# Patient Record
Sex: Female | Born: 1985 | Race: White | Hispanic: No | Marital: Single | Smoking: Current every day smoker
Health system: Southern US, Community
[De-identification: ages and names within clinical notes are randomized; demographics above are authoritative.]

## PROBLEM LIST (undated history)

## (undated) ENCOUNTER — Inpatient Hospital Stay (HOSPITAL_COMMUNITY): Payer: Self-pay

## (undated) DIAGNOSIS — D649 Anemia, unspecified: Secondary | ICD-10-CM

## (undated) DIAGNOSIS — Q210 Ventricular septal defect: Secondary | ICD-10-CM

## (undated) DIAGNOSIS — F32A Depression, unspecified: Secondary | ICD-10-CM

## (undated) DIAGNOSIS — F329 Major depressive disorder, single episode, unspecified: Secondary | ICD-10-CM

## (undated) DIAGNOSIS — R51 Headache: Secondary | ICD-10-CM

## (undated) DIAGNOSIS — Z8709 Personal history of other diseases of the respiratory system: Secondary | ICD-10-CM

## (undated) DIAGNOSIS — F419 Anxiety disorder, unspecified: Secondary | ICD-10-CM

## (undated) DIAGNOSIS — R011 Cardiac murmur, unspecified: Secondary | ICD-10-CM

## (undated) DIAGNOSIS — K649 Unspecified hemorrhoids: Secondary | ICD-10-CM

## (undated) HISTORY — DX: Cardiac murmur, unspecified: R01.1

## (undated) HISTORY — PX: TONSILLECTOMY AND ADENOIDECTOMY: SHX28

## (undated) HISTORY — PX: WISDOM TOOTH EXTRACTION: SHX21

## (undated) HISTORY — DX: Ventricular septal defect: Q21.0

---
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
## 2004-01-12 ENCOUNTER — Ambulatory Visit: Payer: Self-pay | Admitting: Family Medicine

## 2004-04-03 ENCOUNTER — Emergency Department (HOSPITAL_COMMUNITY): Admission: EM | Admit: 2004-04-03 | Discharge: 2004-04-03 | Payer: Self-pay | Admitting: Family Medicine

## 2004-04-11 ENCOUNTER — Ambulatory Visit (HOSPITAL_COMMUNITY): Admission: RE | Admit: 2004-04-11 | Discharge: 2004-04-11 | Payer: Self-pay | Admitting: Family Medicine

## 2004-04-11 ENCOUNTER — Ambulatory Visit: Payer: Self-pay | Admitting: Sports Medicine

## 2004-04-28 ENCOUNTER — Ambulatory Visit: Payer: Self-pay | Admitting: Family Medicine

## 2004-05-29 ENCOUNTER — Ambulatory Visit: Payer: Self-pay | Admitting: Sports Medicine

## 2004-06-11 ENCOUNTER — Emergency Department (HOSPITAL_COMMUNITY): Admission: EM | Admit: 2004-06-11 | Discharge: 2004-06-11 | Payer: Self-pay | Admitting: Family Medicine

## 2004-06-15 ENCOUNTER — Emergency Department (HOSPITAL_COMMUNITY): Admission: EM | Admit: 2004-06-15 | Discharge: 2004-06-15 | Payer: Self-pay | Admitting: Family Medicine

## 2004-06-16 ENCOUNTER — Emergency Department (HOSPITAL_COMMUNITY): Admission: EM | Admit: 2004-06-16 | Discharge: 2004-06-16 | Payer: Self-pay | Admitting: Family Medicine

## 2004-08-16 ENCOUNTER — Ambulatory Visit: Payer: Self-pay | Admitting: Family Medicine

## 2004-10-30 ENCOUNTER — Ambulatory Visit: Payer: Self-pay | Admitting: Family Medicine

## 2004-11-15 ENCOUNTER — Ambulatory Visit: Payer: Self-pay | Admitting: Family Medicine

## 2004-11-27 ENCOUNTER — Ambulatory Visit: Payer: Self-pay | Admitting: Family Medicine

## 2005-05-15 ENCOUNTER — Ambulatory Visit: Payer: Self-pay | Admitting: Family Medicine

## 2005-08-08 ENCOUNTER — Ambulatory Visit: Payer: Self-pay | Admitting: Family Medicine

## 2005-08-10 ENCOUNTER — Encounter: Admission: RE | Admit: 2005-08-10 | Discharge: 2005-08-10 | Payer: Self-pay | Admitting: Sports Medicine

## 2005-08-20 ENCOUNTER — Ambulatory Visit: Payer: Self-pay | Admitting: Family Medicine

## 2005-09-06 ENCOUNTER — Emergency Department (HOSPITAL_COMMUNITY): Admission: EM | Admit: 2005-09-06 | Discharge: 2005-09-06 | Payer: Self-pay | Admitting: Family Medicine

## 2006-04-11 DIAGNOSIS — F32A Depression, unspecified: Secondary | ICD-10-CM | POA: Insufficient documentation

## 2006-04-11 DIAGNOSIS — F329 Major depressive disorder, single episode, unspecified: Secondary | ICD-10-CM | POA: Insufficient documentation

## 2006-04-11 DIAGNOSIS — F909 Attention-deficit hyperactivity disorder, unspecified type: Secondary | ICD-10-CM

## 2006-04-11 DIAGNOSIS — Q210 Ventricular septal defect: Secondary | ICD-10-CM | POA: Insufficient documentation

## 2006-04-12 ENCOUNTER — Encounter (INDEPENDENT_AMBULATORY_CARE_PROVIDER_SITE_OTHER): Payer: Self-pay | Admitting: *Deleted

## 2007-01-31 ENCOUNTER — Ambulatory Visit: Payer: Self-pay | Admitting: Family Medicine

## 2007-01-31 DIAGNOSIS — F17200 Nicotine dependence, unspecified, uncomplicated: Secondary | ICD-10-CM

## 2007-02-03 LAB — CONVERTED CEMR LAB
Cholesterol: 151 mg/dL (ref 0–200)
HDL: 53 mg/dL (ref >39)
LDL Cholesterol: 87 mg/dL (ref 0–99)
TSH: 1.331 microintl units/mL (ref 0.350–5.50)
Total CHOL/HDL Ratio: 2.8

## 2007-03-25 ENCOUNTER — Ambulatory Visit: Payer: Self-pay | Admitting: Family Medicine

## 2007-03-25 DIAGNOSIS — L501 Idiopathic urticaria: Secondary | ICD-10-CM

## 2007-03-28 ENCOUNTER — Ambulatory Visit: Payer: Self-pay | Admitting: Internal Medicine

## 2007-04-05 ENCOUNTER — Emergency Department (HOSPITAL_COMMUNITY): Admission: EM | Admit: 2007-04-05 | Discharge: 2007-04-05 | Payer: Self-pay | Admitting: Family Medicine

## 2007-05-27 ENCOUNTER — Ambulatory Visit: Payer: Self-pay | Admitting: Family Medicine

## 2007-05-27 ENCOUNTER — Other Ambulatory Visit: Admission: RE | Admit: 2007-05-27 | Discharge: 2007-05-27 | Payer: Self-pay | Admitting: Emergency Medicine

## 2007-05-27 ENCOUNTER — Encounter: Payer: Self-pay | Admitting: Family Medicine

## 2007-05-27 LAB — CONVERTED CEMR LAB: Chlamydia, DNA Probe: NEGATIVE

## 2007-05-30 ENCOUNTER — Encounter: Payer: Self-pay | Admitting: Family Medicine

## 2007-08-14 ENCOUNTER — Ambulatory Visit: Payer: Self-pay | Admitting: Family Medicine

## 2007-08-14 ENCOUNTER — Telehealth: Payer: Self-pay | Admitting: *Deleted

## 2007-08-20 ENCOUNTER — Ambulatory Visit: Payer: Self-pay | Admitting: Family Medicine

## 2008-05-12 ENCOUNTER — Telehealth: Payer: Self-pay | Admitting: *Deleted

## 2008-05-27 ENCOUNTER — Telehealth: Payer: Self-pay | Admitting: *Deleted

## 2008-06-01 ENCOUNTER — Ambulatory Visit: Payer: Self-pay | Admitting: Family Medicine

## 2008-06-01 ENCOUNTER — Encounter: Payer: Self-pay | Admitting: Family Medicine

## 2008-06-01 ENCOUNTER — Other Ambulatory Visit: Admission: RE | Admit: 2008-06-01 | Discharge: 2008-06-01 | Payer: Self-pay | Admitting: Family Medicine

## 2008-06-01 LAB — CONVERTED CEMR LAB: Pap Smear: NORMAL

## 2008-06-04 ENCOUNTER — Encounter: Payer: Self-pay | Admitting: Family Medicine

## 2008-06-25 ENCOUNTER — Telehealth: Payer: Self-pay | Admitting: Family Medicine

## 2008-07-01 ENCOUNTER — Emergency Department (HOSPITAL_COMMUNITY): Admission: EM | Admit: 2008-07-01 | Discharge: 2008-07-01 | Payer: Self-pay | Admitting: Family Medicine

## 2008-07-09 ENCOUNTER — Ambulatory Visit: Payer: Self-pay | Admitting: Family Medicine

## 2008-07-09 DIAGNOSIS — J329 Chronic sinusitis, unspecified: Secondary | ICD-10-CM | POA: Insufficient documentation

## 2008-09-07 ENCOUNTER — Encounter: Payer: Self-pay | Admitting: Family Medicine

## 2008-10-15 ENCOUNTER — Ambulatory Visit: Payer: Self-pay | Admitting: Family Medicine

## 2008-10-20 ENCOUNTER — Ambulatory Visit: Payer: Self-pay | Admitting: Family Medicine

## 2008-10-22 ENCOUNTER — Ambulatory Visit: Payer: Self-pay | Admitting: Family Medicine

## 2008-10-27 ENCOUNTER — Telehealth: Payer: Self-pay | Admitting: Family Medicine

## 2008-10-28 ENCOUNTER — Encounter: Payer: Self-pay | Admitting: Family Medicine

## 2008-10-28 ENCOUNTER — Ambulatory Visit: Payer: Self-pay | Admitting: Family Medicine

## 2008-10-28 DIAGNOSIS — J069 Acute upper respiratory infection, unspecified: Secondary | ICD-10-CM | POA: Insufficient documentation

## 2008-10-28 DIAGNOSIS — R1011 Right upper quadrant pain: Secondary | ICD-10-CM

## 2008-11-01 LAB — CONVERTED CEMR LAB
AST: 13 units/L (ref 0–37)
Alkaline Phosphatase: 60 units/L (ref 39–117)
CO2: 23 meq/L (ref 19–32)
GGT: 13 units/L (ref 7–51)
Glucose, Bld: 82 mg/dL (ref 70–99)
Hemoglobin: 14 g/dL (ref 12.0–15.0)
Lymphs Abs: 2.4 10*3/uL (ref 0.7–4.0)
MCHC: 33.7 g/dL (ref 30.0–36.0)
MCV: 96.3 fL (ref 78.0–100.0)
Monocytes Relative: 11 % (ref 3–12)
Potassium: 3.8 meq/L (ref 3.5–5.3)
Total Protein: 7.2 g/dL (ref 6.0–8.3)
WBC: 6 10*3/uL (ref 4.0–10.5)

## 2008-11-24 ENCOUNTER — Telehealth: Payer: Self-pay | Admitting: Family Medicine

## 2008-12-20 ENCOUNTER — Telehealth: Payer: Self-pay | Admitting: *Deleted

## 2008-12-28 ENCOUNTER — Ambulatory Visit: Payer: Self-pay | Admitting: Family Medicine

## 2008-12-28 ENCOUNTER — Encounter: Payer: Self-pay | Admitting: Family Medicine

## 2008-12-30 ENCOUNTER — Ambulatory Visit: Payer: Self-pay | Admitting: Family Medicine

## 2009-02-14 ENCOUNTER — Telehealth: Payer: Self-pay | Admitting: Family Medicine

## 2009-02-18 ENCOUNTER — Encounter (INDEPENDENT_AMBULATORY_CARE_PROVIDER_SITE_OTHER): Payer: Self-pay | Admitting: *Deleted

## 2009-05-03 ENCOUNTER — Encounter: Payer: Self-pay | Admitting: Family Medicine

## 2009-05-03 ENCOUNTER — Other Ambulatory Visit: Admission: RE | Admit: 2009-05-03 | Discharge: 2009-05-03 | Payer: Self-pay | Admitting: Family Medicine

## 2009-05-03 ENCOUNTER — Ambulatory Visit: Payer: Self-pay | Admitting: Family Medicine

## 2009-05-03 LAB — CONVERTED CEMR LAB
Chlamydia, DNA Probe: NEGATIVE
Pap Smear: NEGATIVE

## 2009-05-10 ENCOUNTER — Encounter: Payer: Self-pay | Admitting: Family Medicine

## 2009-09-17 ENCOUNTER — Telehealth: Payer: Self-pay | Admitting: Family Medicine

## 2009-11-18 ENCOUNTER — Encounter: Payer: Self-pay | Admitting: Family Medicine

## 2009-11-18 ENCOUNTER — Ambulatory Visit: Payer: Self-pay | Admitting: Family Medicine

## 2009-11-18 DIAGNOSIS — A630 Anogenital (venereal) warts: Secondary | ICD-10-CM | POA: Insufficient documentation

## 2009-11-18 LAB — CONVERTED CEMR LAB: Whiff Test: NEGATIVE

## 2009-11-21 ENCOUNTER — Ambulatory Visit: Payer: Self-pay | Admitting: Family Medicine

## 2010-01-12 ENCOUNTER — Encounter: Payer: Self-pay | Admitting: Sports Medicine

## 2010-03-14 NOTE — Progress Notes (Signed)
Summary: Outside call  Phone Note Call from Patient   Summary of Call: 1 week of sore thoat with exudates, associated with right ear and jaw pain.  Subjective fevers. Bilateral eye drainage especially in mornings.  No dyspnea, chest pain or palpitation. Can eat and drink.  Agreeable to calling Monday for appt. Red flags given  Initial call taken by: Clementeen Graham MD,  September 17, 2009 12:24 PM

## 2010-03-14 NOTE — Letter (Signed)
Summary: Generic Letter  [facility]  [address]   [place], [place] [address]   Phone: [phone]  Fax: [phone]    05/10/2009  WYNNE ROZAK [address] [place], [place]  [address]  Dear Ms. ROWEN,   Your PAP and all testing were negative and normal.  Good luck with school, keep it up, you will be happy you have it.        Sincerely,   Luretha Murphy NP  Appended Document: Generic Letter mailed.

## 2010-03-14 NOTE — Assessment & Plan Note (Signed)
Summary: read tb test,tcb  Nurse Visit   Allergies: 1)  ! Asa  PPD Results    Date of reading: 11/21/2009    Results: 0 mm    Interpretation: negative  Orders Added: 1)  No Charge Patient Arrived (NCPA0) [NCPA0]

## 2010-03-14 NOTE — Letter (Signed)
Summary: Appointment - Reminder 2  [facility], Main Office  [address]   [place], [place] [address]   Phone: [phone]  Fax: [phone]     February 18, 2009 MRN: [id number]   [facility] [address] [place], [place]  [address]   Dear Ms. FILLINGIM,  Our records indicate that it is time to schedule a follow-up appointment with Dr. Tenny Craw. It is very important that we reach you to schedule this appointment. We look forward to participating in your health care needs. Please contact us at the number listed above at your earliest convenience to schedule your appointment.  If you are unable to make an appointment at this time, give [place] a call so we can update our records.  Sincerely,   Migdalia Dk [facility] Scheduling Team

## 2010-03-14 NOTE — Assessment & Plan Note (Signed)
Summary: female prob,df   Vital Signs:  Patient profile:   25 year old female Height:      63.25 inches Weight:      172.5 pounds Pulse rate:   72 / minute BP sitting:   132 / 78  (right arm) Cuff size:   regular  Vitals Entered By: Arlyss Repress CMA, (November 18, 2009 1:36 PM) CC: std check. flu shot. Is Patient Diabetic? No Pain Assessment Patient in pain? no        Primary Care Provider:  Doralee Albino MD  CC:  std check. flu shot..  History of Present Illness: Here for STD testing, boyfriend has been with another woman.  She also has a wart-like lesions on her internal labia, she had been treated in the past for such with topical Aldara.  Habits & Providers  Alcohol-Tobacco-Diet     Tobacco Status: current     Tobacco Counseling: to quit use of tobacco products     Cigarette Packs/Day: 0.5  Current Medications (verified): 1)  Ortho Tri-Cyclen Lo 0.025 Mg  Tabs (Norgestimate-Ethinyl Estradiol) .... As Directed.  Disp One Packet 2)  Lexapro 10 Mg Tabs (Escitalopram Oxalate) .... One Daily 3)  Fluticasone Propionate 50 Mcg/act Susp (Fluticasone Propionate) .... One Puff Each Nostril Daily 4)  Concerta 18 Mg Cr-Tabs (Methylphenidate Hcl) .... One By Mouth Daily 5)  Aldara 5 % Crea (Imiquimod) .... Apply To Area As Directed  Allergies (verified): 1)  ! Asa  Review of Systems GU:  Denies abnormal vaginal bleeding, discharge, and genital sores.  Physical Exam  General:  Well-developed,well-nourished,in no acute distress; alert,appropriate and cooperative throughout examination Genitalia:  Normal introitus for age, no external lesions, no vaginal discharge, mucosa pink and moist, no vaginal or cervical lesions, no vaginal atrophy, no friaility or hemorrhage, normal uterus size and position, no adnexal masses or tenderness; small wart like lesion on internal labia.   Complete Medication List: 1)  Ortho Tri-cyclen Lo 0.025 Mg Tabs (Norgestimate-ethinyl estradiol)  .... As directed.  disp one packet 2)  Lexapro 10 Mg Tabs (Escitalopram oxalate) .... One daily 3)  Fluticasone Propionate 50 Mcg/act Susp (Fluticasone propionate) .... One puff each nostril daily 4)  Concerta 18 Mg Cr-tabs (Methylphenidate hcl) .... One by mouth daily 5)  Aldara 5 % Crea (Imiquimod) .... Apply to area as directed  Other Orders: GC/Chlamydia-[facility] (87591/87491) Wet Prep- [facility] [phone]) Influenza Vaccine NON MCR (51884) TB Skin Test ([address]) Admin 1st Vaccine (30160) [facility]- Est Level  3 ([address])  Patient Instructions: 1)  Please schedule a follow-up appointment as needed .  Prescriptions: ORTHO TRI-CYCLEN LO 0.025 MG  TABS (NORGESTIMATE-ETHINYL ESTRADIOL) as directed.  Disp one packet  #1 x 11   Entered and Authorized by:   Luretha Murphy NP   Signed by:   Luretha Murphy NP on 11/18/2009   Method used:   Electronically to        [id number]* (retail)       [address]       [place], [place]  [address]       Ph: [phone]       Fax: [phone]   RxID:   [id number] LEXAPRO 10 MG TABS (ESCITALOPRAM OXALATE) one daily  #30 x 11   Entered and Authorized by:   Luretha Murphy NP   Signed by:   Luretha Murphy NP on 11/18/2009   Method used:   Electronically  to        [id number]* (retail)       [address]       [place], [place]  [address]       Ph: [phone]       Fax: [phone]   RxID:   [id number] ALDARA 5 % CREA (IMIQUIMOD) apply to area as directed  #1 x 0   Entered and Authorized by:   Luretha Murphy NP   Signed by:   Luretha Murphy NP on 11/18/2009   Method used:   Electronically to        [id number]* (retail)       [address]       [place], [place]  [address]       Ph: [phone]       Fax: [phone]   RxID:   [id number]    Immunizations Administered:  Influenza Vaccine # 1:    Vaccine  Type: Fluvax Non-MCR    Site: right deltoid    Mfr: GlaxoSmithKline    Dose: 0.5 ml    Route: IM    Given by: Loralee Pacas CMA    Exp. Date: 08/09/2010    Lot #: [id number]    VIS given: 09/06/09 version given November 18, 2009.  PPD Skin Test:    Vaccine Type: PPD    Site: right forearm    Mfr: Sanofi Pasteur    Dose: 0.1 ml    Route: ID    Given by: Loralee Pacas CMA    Exp. Date: 08/19/2011    Lot #: [id number]  Flu Vaccine Consent Questions:    Do you have a history of severe allergic reactions to this vaccine? no    Any prior history of allergic reactions to egg and/or gelatin? no    Do you have a sensitivity to the preservative Thimersol? no    Do you have a past history of Guillan-Barre Syndrome? no    Do you currently have an acute febrile illness? no    Have you ever had a severe reaction to latex? no    Vaccine information given and explained to patient? yes    Are you currently pregnant? no   Laboratory Results  Date/Time Received: November 18, 2009 1:57 PM  Date/Time Reported: November 18, 2009 2:04 PM   [employer] Source: vaginal WBC/hpf: 1-5 Bacteria/hpf: 3+  Rods Clue cells/hpf: none  Negative whiff Yeast/hpf: none Trichomonas/hpf: none Comments: ...........test performed by...........Marland KitchenTerese Door, CMA

## 2010-03-14 NOTE — Miscellaneous (Signed)
  Clinical Lists Changes  Medications: Removed medication of ALDARA 5 % CREA (IMIQUIMOD) apply to area as directed

## 2010-03-14 NOTE — Miscellaneous (Signed)
Summary: Treating family for strep throat  Daughter with positive strep and sore throat. Mother and Father also with symptoms of malaise and sore throat. None of them are allergic to PCN or amoxicillin. Treating with Amox 875mg  by mouth two times a day x 10d for mother and father. #40 for both parents. Rodney Langton MD  January 12, 2010 9:53 AM  Medications: Added new medication of AMOXICILLIN 875 MG TABS (AMOXICILLIN) One tab by mouth two times a day - Signed Rx of AMOXICILLIN 875 MG TABS (AMOXICILLIN) One tab by mouth two times a day;  #40 x 0;  Signed;  Entered by: Rodney Langton MD;  Authorized by: Rodney Langton MD;  Method used: Print then Give to Patient    Prescriptions: AMOXICILLIN 875 MG TABS (AMOXICILLIN) One tab by mouth two times a day  #40 x 0   Entered and Authorized by:   Rodney Langton MD   Signed by:   Rodney Langton MD on 01/12/2010   Method used:   Print then Give to Patient   RxID:   [id number]

## 2010-03-14 NOTE — Assessment & Plan Note (Signed)
Summary: cpe with pap/kh   Vital Signs:  Patient profile:   25 year old female Weight:      178 pounds Pulse rate:   80 / minute BP sitting:   130 / 76  (right arm)  Vitals Entered By: Arlyss Repress CMA, (May 03, 2009 3:04 PM) CC: pe with pap   Primary Care Provider:  Doralee Albino MD  CC:  pe with pap.  History of Present Illness: Here for CPE and PAP.  Denies any problems.  History of genital warts in past, no noticable lesions.  Noraml PAP history.  Remains on OCPs.  Enganged to father of her 47 year old child.  In school to become an ENT.  Current Medications (verified): 1)  Ortho Tri-Cyclen Lo 0.025 Mg  Tabs (Norgestimate-Ethinyl Estradiol) .... As Directed.  Disp One Packet 2)  Lexapro 10 Mg Tabs (Escitalopram Oxalate) .... One Daily 3)  Fluticasone Propionate 50 Mcg/act Susp (Fluticasone Propionate) .... One Puff Each Nostril Daily 4)  Concerta 18 Mg Cr-Tabs (Methylphenidate Hcl) .... One By Mouth Daily 5)  Zofran 4 Mg Tabs (Ondansetron Hcl) .... One Tablet By Mouth Every 6 Horus As Needed Nausea and Vomiting  Allergies (verified): 1)  ! Asa  Review of Systems GU:  Denies abnormal vaginal bleeding, discharge, dysuria, genital sores, and urinary frequency.  Physical Exam  General:  Well-developed,well-nourished,in no acute distress; alert,appropriate and cooperative throughout examination Genitalia:  Normal introitus for age, no external lesions, no vaginal discharge, mucosa pink and moist, no vaginal or cervical lesions, no vaginal atrophy, no friaility or hemorrhage, normal uterus size and position, no adnexal masses or tenderness   Impression & Recommendations:  Problem # 1:  SCREENING FOR MALIGNANT NEOPLASM OF THE CERVIX (ICD-V76.2)  Orders: Pap Smear-[facility] (42706-23762) [facility] - Est  18-39 yrs (83151)  Problem # 2:  CONTACT OR EXPOSURE TO OTHER VIRAL DISEASES (ICD-V01.79)  Orders: GC/Chlamydia-[facility] (87591/87491) HIV-[facility] (76160-73710) Wet Prep- [facility]  (62694) [facility] - Est  18-39 yrs (85462)  Complete Medication List: 1)  Ortho Tri-cyclen Lo 0.025 Mg Tabs (Norgestimate-ethinyl estradiol) .... As directed.  disp one packet 2)  Lexapro 10 Mg Tabs (Escitalopram oxalate) .... One daily 3)  Fluticasone Propionate 50 Mcg/act Susp (Fluticasone propionate) .... One puff each nostril daily 4)  Concerta 18 Mg Cr-tabs (Methylphenidate hcl) .... One by mouth daily 5)  Zofran 4 Mg Tabs (Ondansetron hcl) .... One tablet by mouth every 6 horus as needed nausea and vomiting  Patient Instructions: 1)  I will contact you regarding your tests today 2)  all meds refilled for 3 months supply Prescriptions: FLUTICASONE PROPIONATE 50 MCG/ACT SUSP (FLUTICASONE PROPIONATE) one puff each nostril daily  #3 x 3   Entered and Authorized by:   Luretha Murphy NP   Signed by:   Luretha Murphy NP on 05/03/2009   Method used:   Electronically to        [id number]* (retail)       [address]       [place], [place]  [address]       Ph: [phone]       Fax: [phone]   RxID:   [id number] LEXAPRO 10 MG TABS (ESCITALOPRAM OXALATE) one daily  #90 x 3   Entered and Authorized by:   Luretha Murphy NP   Signed by:   Luretha Murphy NP on 05/03/2009   Method used:   Electronically to  [id number]* (retail)       [address]       [place], [place]  [address]       Ph: [phone]       Fax: [phone]   RxID:   [id number] ORTHO TRI-CYCLEN LO 0.025 MG  TABS (NORGESTIMATE-ETHINYL ESTRADIOL) as directed.  Disp one packet  #3 x 3   Entered and Authorized by:   Luretha Murphy NP   Signed by:   Luretha Murphy NP on 05/03/2009   Method used:   Electronically to        [id number]* (retail)       [address]       [place], [place]  [address]       Ph: [phone]       Fax: [phone]   RxID:    [id number]   Laboratory Results  Date/Time Received: May 03, 2009 3:15 PM  Date/Time Reported: May 03, 2009 3:31 PM   [employer] Source: vag WBC/hpf: 0-2 Bacteria/hpf: 2+  Rods Clue cells/hpf: none  Negative whiff Yeast/hpf: none Trichomonas/hpf: none Comments: ...............test performed by......Marland KitchenBonnie A. [place], MLS (ASCP)cm

## 2010-03-14 NOTE — Progress Notes (Signed)
Summary: refill  Phone Note Refill Request Call back at Home Phone [phone] Message from:  Patient  Refills Requested: Medication #1:  CONCERTA 18 MG CR-TABS one by mouth daily Please call when ready  Initial call taken by: De Nurse,  February 14, 2009 3:47 PM  Follow-up for Phone Call        to pcp Follow-up by: Golden Circle RN,  February 14, 2009 3:55 PM  Additional Follow-up for Phone Call Additional follow up Details #1::        Handwritten Rx placed up front and patient notified. Additional Follow-up by: Doralee Albino MD,  February 14, 2009 4:04 PM

## 2010-03-14 NOTE — Letter (Signed)
Summary: Pediatric Teaching Program  Pediatric Teaching Program   Imported By: Marylou Mccoy 12/10/2008 13:35:03  _____________________________________________________________________  External Attachment:    Type:   Image     Comment:   External Document

## 2010-05-23 LAB — POCT URINALYSIS DIP (DEVICE)
Bilirubin Urine: NEGATIVE
Glucose, UA: NEGATIVE mg/dL
Ketones, ur: NEGATIVE mg/dL
Nitrite: NEGATIVE
Protein, ur: NEGATIVE mg/dL
Specific Gravity, Urine: 1.025 (ref 1.005–1.030)
Urobilinogen, UA: 0.2 mg/dL (ref 0.0–1.0)
pH: 5.5 (ref 5.0–8.0)

## 2010-05-23 LAB — DIFFERENTIAL
Basophils Absolute: 0.1 K/uL (ref 0.0–0.1)
Basophils Relative: 1 % (ref 0–1)
Eosinophils Absolute: 0 K/uL (ref 0.0–0.7)
Eosinophils Relative: 0 % (ref 0–5)
Lymphocytes Relative: 15 % (ref 12–46)
Lymphs Abs: 1.7 K/uL (ref 0.7–4.0)
Monocytes Absolute: 1.2 K/uL — ABNORMAL HIGH (ref 0.1–1.0)
Monocytes Relative: 11 % (ref 3–12)
Neutro Abs: 8.5 K/uL — ABNORMAL HIGH (ref 1.7–7.7)
Neutrophils Relative %: 74 % (ref 43–77)

## 2010-05-23 LAB — CBC
HCT: 42.1 % (ref 36.0–46.0)
Hemoglobin: 14.7 g/dL (ref 12.0–15.0)
MCHC: 34.9 g/dL (ref 30.0–36.0)
MCV: 97.3 fL (ref 78.0–100.0)
Platelets: 237 K/uL (ref 150–400)
RBC: 4.32 MIL/uL (ref 3.87–5.11)
RDW: 12.8 % (ref 11.5–15.5)
WBC: 11.5 K/uL — ABNORMAL HIGH (ref 4.0–10.5)

## 2010-05-23 LAB — POCT PREGNANCY, URINE: Preg Test, Ur: NEGATIVE

## 2010-06-27 NOTE — Assessment & Plan Note (Signed)
Kelsey [name]                            Kelsey [name]   Kelsey [name], Kelsey [name]                     MRN:          [id number]  DATE:03/28/2007                            DOB:          1985-08-08    IDENTIFICATION:  Kelsey [name] is a 25 year old with a history of a VSD  previously followed by Lorna Few who presents for continued care.   The patient was last seen in 2004.  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  Her last  echocardiogram was done in 2003 that showed normal LV function.  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  The velocity  through the defect was 4.5 meters per second.  [name], there was very mild  eccentric aortic insufficiency.  Plan was for continued followup.   The patient is followed by Doralee Albino in the [facility],  and she presents for continued care.  She was last seen there about a  month ago.   On talking to the patient, she is active.  She is a mother of 1 child  age 58 (she was pregnant when she was last seen).  She notes no  significant shortness of breath, no significant chest pains, no  palpitations, no dizziness.   ALLERGIES:  ASPIRIN.   CURRENT MEDICATIONS:  Ortho Tri-Cyclen.   PAST MEDICAL HISTORY:  1. VSD.  2. History of rheumatic fever in 1993.   SOCIAL HISTORY:  The patient smokes a half-pack per day.  A lot of her  family members do, including her significant other.  She drinks  occasionally.  She uses rare marijuana.   FAMILY HISTORY:  Negative for premature CAD.   REVIEW OF SYSTEMS:  Notes only occasional chest pains not associated  with any particular activity, occasional headaches.  Has a history in  the past of asthma.  No active wheezes.   PHYSICAL EXAMINATION:  GENERAL:  The patient is in no acute distress.  VITAL SIGNS:  Blood pressure 128/78, pulse is 93, weight is 193.  HEENT:  Normocephalic,  atraumatic.  EOMI.  PERRL.  Mucous membranes  moist.  NECK:  No thyromegaly.  No JVD.  No bruits.  LUNGS:  Rhonchi that clear with cough.  Rare wheeze.  CARDIAC:  Regular rate and rhythm.  S1 and S2.  Grade 2/6 fairly high-  pitched systolic murmur heard best at the left sternal border.  No  diastolic murmurs are audible in multiple positions.  PMI is not  displaced.  ABDOMEN:  Supple, nontender.  No hepatomegaly.  EXTREMITIES:  Good distal pulses throughout.  No lower extremity edema.   12-lead EKG shows normal sinus rhythm at 78 beats per minute.   IMPRESSION:  1. Ventricular septal defect.  Examination today shows I do not hear      any murmur consistent with aortic insufficiency.  Again, this is      what needs to be watched for.  I would like to get an      echocardiogram, though, to redefine this.  Again,  it had been noted      to be very mild in the past.  If there is any exaggeration, will      need closer followup and possible intervention.  Again, though,      physical exam does not suggest this.  I encouraged her to stay      active, no limitations.  2. Health care maintenance.  Again, I spent a long time talking to the      patient about smoking.  On exam today, she has got some rhonchi,      rare wheeze, and I think she may have some reactive airway issues      with the tobacco.  I encouraged her to talk her family members.  I      would be happy to talk her family members about this.   I have also counseled her on other drug use, with marijuana being rare.   At some point will need to have a fasting lipid panel checked.   I will be in touch with the patient regarding her echocardiogram  results.  Tentative followup for 1 year.     Pricilla Riffle, MD, [facility]  Electronically Signed    PVR/MedQ  DD: 04/03/2007  DT: 04/04/2007  Job #: [id number]

## 2010-06-30 NOTE — Discharge Summary (Signed)
NAME:  Kelsey [name], Kelsey [name]                        ACCOUNT NO.:  [id number]   MEDICAL RECORD NO.:  [id number]                   PATIENT TYPE:  INP   LOCATION:  9117                                 FACILITY:  [facility]   PHYSICIAN:  Phil D. Okey Dupre, M.D.                  DATE OF BIRTH:  [birth year]   DATE OF ADMISSION:  08/25/2002  DATE OF DISCHARGE:  08/29/2002                                 DISCHARGE SUMMARY   PRIMARY MEDICAL DOCTOR:  Dr. Jonah Blue at [facility].   DISCHARGE DIAGNOSES:  1. Status post low transverse cesarean section secondary to persistent fetal     bradycardia on August 26, 2002.  2. Status post delivery of a viable female infant on August 26, 2002 at 0852     hours.  3. Ventricular septal defect status post antibiotics for prophylaxis.   DISCHARGE MEDICATIONS:  1. Ibuprofen 600 mg p.o. q.6h. p.r.n. pain.  2. Percocet 5/325 mg one to two tabs p.o. q.4-6h. p.r.n. severe pain (#20)     no refills.  3. Prenatal vitamin one p.o. once daily x6 weeks or while breast-feeding.  4. Depo-Provera 150 mg IM every 3 months.   PROCEDURES AND DIAGNOSTIC STUDIES:  1. Prenatal ultrasound on August 25, 2002 showed oligohydramnios.  2. Stat low transverse C-section with a vertical incision under general     anesthesia on August 26, 2002 secondary to persistent fetal bradycardia.   BRIEF HISTORY AND HOSPITAL COURSE:  The patient is an 25 year old G1, P0  presenting at 40 weeks secondary to oligohydramnios diagnosed by routine  ultrasound for BPP.  The patient was admitted for induction of labor with  Cervidil.  Prenatal labs - AB positive, antibody negative, rubella immune,  hepatitis B surface antigen negative, RPR negative, HIV negative, GC  negative, Chlamydia positive that was treated, test of cure negative, GBS  negative.  The patient was admitted on August 25, 2002.  Cervidil was placed  x1.  The patient did well overnight but was having significant contractions  the next morning.  AROM was performed with resultant moderate particulate  meconium-stained fluid and IUPC was placed and amnioinfusion was started and  a fetal scalp electrode was placed.  Vaginal exam was 2 cm, 80-90%  effacement, and -2 station.  An epidural was requested for pain.  The  patient was contracting approximately every 1-2 minutes and the baby rapidly  developed fetal bradycardia with fetal heart rate in the 60-70 range and  poor response to fetal scalp stimulation.  Given the patient's unfavorable  cervical dilation, she was taken for stat C-section performed by Dr. Okey Dupre  with Dr. Ophelia Charter as a first assistant.  Infant was delivered without  difficulty, Apgars were 8 and 9, and the mother and infant were stable.  The  infant went to the newborn nursery.  Routine postpartum care and  postoperative care was provided  and the patient was continued on antibiotics  for a T-max of 100.4.  Antibiotics were continued for 48 hours post delivery  and then discontinued.  The patient developed no further difficulties during  her hospitalization and was discharged on postoperative day #3.  Discharge  laboratories - WBC 12.5, hemoglobin 11.2, platelets 224, RPR negative.  The  patient and infant were discharged to home without further difficulty.    Jonah Blue, M.D.                      Phil D. Okey Dupre, M.D.   Milas Gain  D:  08/28/2002  T:  08/30/2002  Job:  [id number]

## 2010-06-30 NOTE — Op Note (Signed)
   NAME:  Kelsey [name], Kelsey [name]                        ACCOUNT NO.:  [id number]   MEDICAL RECORD NO.:  [id number]                   PATIENT TYPE:  INP   LOCATION:  9117                                 FACILITY:  [facility]   PHYSICIAN:  Phil D. Okey Dupre, M.D.                  DATE OF BIRTH:  10-16-1985   DATE OF PROCEDURE:  08/26/2002  DATE OF DISCHARGE:                                 OPERATIVE REPORT   PROCEDURE:  Low transverse cesarean section.   PREOPERATIVE DIAGNOSIS:  Fetal bradycardia and oligohydramnios.   POSTOPERATIVE DIAGNOSIS:  Fetal bradycardia and oligohydramnios.   SURGEON:  Javier Glazier. Okey Dupre, M.D.   FIRST ASSISTANT:  Jonah Blue, M.D.   PROCEDURE:  Under satisfactory general anesthesia, with patient in the  dorsal supine position, Foley catheter in the urinary bladder, the abdomen  prepped and draped in the usual sterile manner, the abdomen was entered  through a vertical incision extending just below the umbilicus just above  the symphysis pubis, opened by layers and entering the peritoneal cavity.  The visceral peritoneum and anterior surface of the uterus were opened  transversely with sharp dissection.  The bladder was pushed away from the  lower uterine segment and was entered by sharp and blunt dissection from a  direct OP presentation.  The baby was easily delivered.  It was a female  weighing 7 pounds 2 ounces with 8 and 9 Apgars, 7.18 cord pH.  The cord was  doubly clamped as the baby was being suctioned with a bulb suction.  There  was a moderate amount of meconium present within the fluid.  The baby was  handed to the pediatrician.  Samples of blood taken from the cord for  analysis and the placenta manually removed.  The uterus was explored and  closed with a continuous running locked 0 Vicryl on an atraumatic needle.  The area was observed for bleeding; none was noted.  The fascia was closed  with continuous running alternating locked 0 Vicryl on an atraumatic  needle.  Subcutaneous flap approximation carried out with interrupted 0 Vicryl  sutures, and the skin edge approximated with skin staples.  Dry, sterile  dressing was applied.  Subcutaneous bleeders were controlled with hot  cautery and the patient transferred to the recovery room in satisfactory  condition with 700 cc blood loss.  Tape, instrument, sponge, and needle  count reported correct at the end of the procedure and Foley catheter was  draining clear urine.                                                Phil D. Okey Dupre, M.D.    PDR/MEDQ  D:  08/26/2002  T:  08/26/2002  Job:  [id number]

## 2010-08-11 ENCOUNTER — Ambulatory Visit: Payer: Self-pay | Admitting: Family Medicine

## 2010-10-20 ENCOUNTER — Other Ambulatory Visit: Payer: Self-pay | Admitting: Family Medicine

## 2010-10-20 MED ORDER — AMOXICILLIN 500 MG PO CAPS: 2000.0000 mg | ORAL_CAPSULE | Freq: Once | ORAL | Status: AC

## 2010-11-20 ENCOUNTER — Encounter: Payer: Self-pay | Admitting: Family Medicine

## 2010-11-20 ENCOUNTER — Ambulatory Visit (INDEPENDENT_AMBULATORY_CARE_PROVIDER_SITE_OTHER): Payer: Self-pay | Admitting: Family Medicine

## 2010-11-20 DIAGNOSIS — Z23 Encounter for immunization: Secondary | ICD-10-CM

## 2010-11-20 DIAGNOSIS — R109 Unspecified abdominal pain: Secondary | ICD-10-CM | POA: Insufficient documentation

## 2010-11-20 DIAGNOSIS — R300 Dysuria: Secondary | ICD-10-CM

## 2010-11-20 DIAGNOSIS — N809 Endometriosis, unspecified: Secondary | ICD-10-CM | POA: Insufficient documentation

## 2010-11-20 LAB — POCT UA - MICROSCOPIC ONLY

## 2010-11-20 LAB — POCT URINALYSIS DIPSTICK
Blood, UA: NEGATIVE
Nitrite, UA: POSITIVE
Urobilinogen, UA: 0.2

## 2010-11-20 MED ORDER — HYDROCODONE-ACETAMINOPHEN 5-500 MG PO TABS: 1.0000 | ORAL_TABLET | Freq: Four times a day (QID) | ORAL | Status: DC | PRN

## 2010-11-20 NOTE — Patient Instructions (Signed)
Endometriosis Cramps, Pain and Infertility Up to ten per cent of women in child bearing years have endometriosis. Endometriosis is a disease that occurs when the endometrium (lining of the uterus) is misplaced outside of its normal location. It may occur in many locations close to the uterus (womb), but commonly on the ovaries, fallopian tubes, vagina (birth canal) and bowel located close to the uterus. Because the uterus sloughs (expels) its lining every month (menses), there is bleeding where ever the endometrial tissue is located. SYMPTOMS Often there are no symptoms (problems); however because blood is irritating to tissues not normally exposed to it, when symptoms occur they vary with the location of the misplaced endometrium. Symptoms often include back and abdominal (belly) pain. Periods may be heavier and intercourse may be painful. Infertility may be present. You may have all of these symptoms at one time or another or you may have months with no symptoms at all. Although the symptoms occur mainly during menses, they can occur mid-cycle as well, and usually terminate with menopause. DIAGNOSIS You will have to be seen by your caregiver for a diagnosis (learning what is wrong). Your caregiver may recommend a blood test and urine test (urinalysis) to help rule out other conditions. Another common test is ultrasound, a painless procedure that uses sound waves to make a sonogram "picture" of abnormal tissue that could be endometriosis. If your bowel movements are painful around your periods, your caregiver may advise a barium enema (an x-ray of the lower bowel), to try to find the source of your pain. This is sometimes confirmed by laparoscopy. Laparoscopy is a procedure where your caregiver looks into your abdomen with a laparoscope (a small pencil sized telescope). Your caregiver may take a tiny piece of tissue (biopsy) from any abnormal tissue to confirm or document your problem. These tissues are sent  to the lab and a pathologist looks at them under the microscope to give a microscopic diagnoses. TREATMENT Once the diagnosis is made, it can be treated by destruction of the misplaced endometrial tissue using heat (diathermy), laser, cutting (excision), or chemical means. It may also be treated with hormonal therapy. When using hormonal therapy menses are eliminated, therefore eliminating the monthly exposure to blood by the misplaced endometrial tissue. Only in severe cases is it necessary to perform a hysterectomy with removal of the tubes, uterus and ovaries. HOME CARE INSTRUCTIONS  Only take over-the-counter or prescription medicines for pain, discomfort, or fever as directed by your caregiver.   Avoid activities that produce pain, including a physical sexual relationship.   Do not take aspirin as this may increase bleeding when not on hormonal therapy.   See your caregiver for pain or problems not controlled with treatment.  SEEK IMMEDIATE MEDICAL CARE IF:  Your pain is severe and is not responding to pain medication.   You develop severe nausea and vomiting or can't keep foods down.   Your pain localizes to the right lower part of your abdomen (possible appendicitis).   There is swelling or increasing pain in the abdomen.   An unexplained oral temperature above 102 F (38.9 C) develops, or as your caregiver suggests.   You see blood in your stool.  MAKE SURE YOU:    Understand these instructions.   Will watch your condition.   Will get help right away if you are not doing well or get worse.  Document Released: 01/27/2000 Document Re-Released: 01/12/2008 [facility] Patient Information 2011 [place], [place].

## 2010-11-20 NOTE — Progress Notes (Signed)
Addended by: [place], Florena Kozma on: 11/20/2010 04:28 PM   Modules accepted: Orders

## 2010-11-20 NOTE — Progress Notes (Signed)
  Subjective:     Kelsey [name] is a 25 y.o. female who presents for evaluation of abdominal pain. Onset was 8 years ago. Started after patient had an emergency c-section. Symptoms have been gradually worsening. The pain is described as cramping, sharp and stabbing, and is 8/10 in intensity. Pain is located in the LUQ and LLQ without radiation.  Aggravating factors: menses.  Alleviating factors: bowel movements and flatus. Associated symptoms: anorexia, constipation and hematochezia. The patient denies arthralagias, dysuria, fever, hematuria, melena, vomiting.  The patient's history has been marked as reviewed and updated as appropriate.  Review of Systems Constitutional: positive for anorexia, chills, night sweats and weight loss, negative for fevers Gastrointestinal: positive for abdominal pain, constipation, dyspepsia and nausea, negative for diarrhea, dysphagia, jaundice, melena, odynophagia, reflux symptoms and vomiting Genitourinary:positive for abnormal menstrual periods, negative for , decreased stream, dysuria, frequency, hematuria, hesitancy, nocturia and urinary incontinence    infertility - not able to get pregnant despite multiple attempts last three years Objective:    BP 122/77  Pulse 85  Temp(Src) 98.3 F (36.8 C) (Oral)  Ht 5' 3.75" (1.619 m)  Wt 163 lb (73.936 kg)  BMI 28.20 kg/m2  LMP 11/03/2010 General appearance: alert, cooperative and mild distress Abdomen: normal findings: no masses palpable and no organomegaly and abnormal findings:  moderate tenderness in the LUQ and in the LLQ Extremities: extremities normal, atraumatic, no cyanosis or edema    Assessment:    Abdominal pain, likely secondary to endometriosis vs. Inflammatory bowel disease .    Plan:  No contraceptives, pt. Trying to get pregnant Discussed fertility with husband as well - sperm count/quality  Referral to Gynecology. Follow up with PCP in 1 month.

## 2010-11-20 NOTE — Progress Notes (Deleted)
  Subjective:    Patient ID: Kelsey [name], female    DOB: [birth year], 25 y.o.   MRN: [id number]  HPI    Review of Systems     Objective:   Physical Exam        Assessment & Plan:

## 2010-11-21 ENCOUNTER — Telehealth: Payer: Self-pay | Admitting: Family Medicine

## 2010-11-21 NOTE — Telephone Encounter (Signed)
Pt is asking about referral to GYN.  Wants to know how the referral works.  pls advise

## 2010-11-22 NOTE — Telephone Encounter (Signed)
Spoke with Dr. Leveda Anna. Have patient come and see him before we put in referral.

## 2010-11-22 NOTE — Telephone Encounter (Signed)
Attempted to call patient home number disconnected........mobile number not right number.Marland Kitchen..Marland Kitchenwork number is not correct.   Please find out if patient has insurance, if not she is going to need to apply for [employer] so we can go through with this referral. If no insurance we will have to put in through [facility] and it may take a while if they even have a specialist to volunteer.

## 2010-11-23 NOTE — Telephone Encounter (Signed)
Pt numbers are not working. Will wait for pt to return call to our office.Kelsey [name], [name]

## 2010-12-15 ENCOUNTER — Ambulatory Visit: Payer: Self-pay | Admitting: Family Medicine

## 2011-03-13 ENCOUNTER — Other Ambulatory Visit: Payer: Self-pay | Admitting: Family Medicine

## 2011-09-18 ENCOUNTER — Other Ambulatory Visit (HOSPITAL_COMMUNITY)
Admission: RE | Admit: 2011-09-18 | Discharge: 2011-09-18 | Disposition: A | Discharge status: Home / Self Care | Payer: Self-pay | Source: Ambulatory Visit | Attending: Family Medicine | Admitting: Family Medicine

## 2011-09-18 ENCOUNTER — Encounter: Payer: Self-pay | Admitting: Family Medicine

## 2011-09-18 ENCOUNTER — Ambulatory Visit (INDEPENDENT_AMBULATORY_CARE_PROVIDER_SITE_OTHER): Payer: Self-pay | Admitting: Family Medicine

## 2011-09-18 VITALS — BP 135/81 | HR 92 | Ht 63.75 in | Wt 156.0 lb

## 2011-09-18 DIAGNOSIS — F17200 Nicotine dependence, unspecified, uncomplicated: Secondary | ICD-10-CM

## 2011-09-18 DIAGNOSIS — Z01419 Encounter for gynecological examination (general) (routine) without abnormal findings: Secondary | ICD-10-CM

## 2011-09-18 DIAGNOSIS — N898 Other specified noninflammatory disorders of vagina: Secondary | ICD-10-CM

## 2011-09-18 DIAGNOSIS — N390 Urinary tract infection, site not specified: Secondary | ICD-10-CM

## 2011-09-18 DIAGNOSIS — R350 Frequency of micturition: Secondary | ICD-10-CM

## 2011-09-18 DIAGNOSIS — Z113 Encounter for screening for infections with a predominantly sexual mode of transmission: Secondary | ICD-10-CM | POA: Insufficient documentation

## 2011-09-18 DIAGNOSIS — F329 Major depressive disorder, single episode, unspecified: Secondary | ICD-10-CM

## 2011-09-18 DIAGNOSIS — F3289 Other specified depressive episodes: Secondary | ICD-10-CM

## 2011-09-18 LAB — POCT URINALYSIS DIPSTICK
Bilirubin, UA: NEGATIVE
Glucose, UA: NEGATIVE
Leukocytes, UA: NEGATIVE
pH, UA: 6.5

## 2011-09-18 LAB — POCT UA - MICROSCOPIC ONLY

## 2011-09-18 LAB — POCT WET PREP (WET MOUNT)

## 2011-09-18 MED ORDER — NORGESTIMATE-ETH ESTRADIOL 0.25-35 MG-MCG PO TABS: 1.0000 | ORAL_TABLET | Freq: Every day | ORAL | Status: DC

## 2011-09-18 MED ORDER — CEPHALEXIN 500 MG PO CAPS: 500.0000 mg | ORAL_CAPSULE | Freq: Two times a day (BID) | ORAL | Status: AC

## 2011-09-18 NOTE — Assessment & Plan Note (Signed)
Keflex x 7 days

## 2011-09-18 NOTE — Assessment & Plan Note (Signed)
Encouraged patient to follow-up with PCP and to consider restarting treatment if she feels her symptoms are impacting her quality of life.

## 2011-09-18 NOTE — Patient Instructions (Signed)
I will call you if you tests show any evidence of infection  I would encourage you to return and talk about your stress with your primary doctor  If we can help you quit smoking, please feel free to schedule a visit with Dr. Raymondo Band or call 1-800-QUITNOW for free telephone counseling  Go to [email or web address] to learn more about long term birth control  I have sent a prescription for birth control to your pharmacy  Take a daily multivitamin

## 2011-09-18 NOTE — Assessment & Plan Note (Signed)
Not ready to quit declines further intervention

## 2011-09-18 NOTE — Progress Notes (Signed)
  Subjective:    Patient ID: Kelsey [name], female    DOB: Aug 09, 1985, 26 y.o.   MRN: [id number]  HPI Annual Gynecological Exam  G1P1 Wt Readings from Last 3 Encounters:  09/18/11 156 lb (70.761 kg)  11/20/10 163 lb (73.936 kg)  11/18/09 172 lb 8 oz (78.245 kg)   Last period: 08/28/2011 Regular periods: yes,  About 7 days,  Light and heavy Heavy bleeding: no  Sexually active: yes Birth control or hormonal therapy: Not using protection, discussed options Hx of WGN:FAOZHYQ desires STD screening Dyspareunia: No Hot flashes: No Vaginal discharge: None Dysuria:No   Last mammogram:No Breast mass or concerns: No Last Pap: 2011, was normal History of abnormal pap: No  FH of breast, uterine, ovarian, colon cancer: No  Patient Information Form: Screening and ROS  AUDIT-C Score: 1 Do you feel safe in relationships? yes PHQ-2:negative  Review of Symptoms  General:  Negative for nexplained weight loss, fever Skin: Negative for new or changing mole, sore that won't heal HEENT: Negative for trouble hearing, trouble seeing, ringing in ears, mouth sores, hoarseness, change in voice, dysphagia. CV:  Negative for chest pain, dyspnea, edema, palpitations Resp: Negative for cough, dyspnea, hemoptysis GI: Negative for nausea, vomiting, diarrhea, constipation, abdominal pain, melena, hematochezia. GU: Negative for dysuria, incontinence, urinary hesitance, hematuria, vaginal or penile discharge, polyuria, sexual difficulty, lumps in testicle or breasts MSK: Negative for muscle cramps or aches, joint pain or swelling Neuro: Negative for headaches, weakness, numbness, dizziness, passing out/fainting Psych: Negative for depression, , memory problems  Notes positive for stress, anxiety  I have reviewed patient's  PMH, FH, and Social history and Medications as related to this visit.  Review of Systems    see HPI Objective:   Physical Exam GEN: Alert & Oriented, No acute distress CV:   Regular Rate & Rhythm, no murmur Respiratory:  Normal work of breathing, CTAB Abd:  + BS, soft, no tenderness to palpation Ext: no pre-tibial edema Pelvic Exam:        External: normal female genitalia without lesions or masses        Vagina: normal without lesions or masses        Cervix: normal without lesions or masses        Adnexa: normal bimanual exam without masses or fullness        Uterus: normal by palpation        Samples for Wet prep, GC/Chlamydia obtained        Assessment & Plan:  Discussed contraception options.  Interested in [place] but wants to read more.  Directed her to [employer].  Will rx sprintec today.  Encouraged daily multivitamin use for folic acid.  Uninsured today, will consider fasting labwork in the future.  Urine with odor x several weeks, no dysuria.  Notes urinary frrequency.

## 2011-09-19 ENCOUNTER — Telehealth: Payer: Self-pay | Admitting: Family Medicine

## 2011-09-19 NOTE — Telephone Encounter (Signed)
Pt advised of UTI and Rx sent to pharmacy.

## 2011-09-20 ENCOUNTER — Encounter: Payer: Self-pay | Admitting: Family Medicine

## 2011-11-09 ENCOUNTER — Emergency Department (INDEPENDENT_AMBULATORY_CARE_PROVIDER_SITE_OTHER)
Admission: EM | Admit: 2011-11-09 | Discharge: 2011-11-09 | Disposition: A | Discharge status: Home / Self Care | Payer: Self-pay | Source: Home / Self Care

## 2011-11-09 ENCOUNTER — Encounter (HOSPITAL_COMMUNITY): Payer: Self-pay | Admitting: *Deleted

## 2011-11-09 ENCOUNTER — Telehealth: Payer: Self-pay | Admitting: Family Medicine

## 2011-11-09 ENCOUNTER — Ambulatory Visit: Payer: Self-pay

## 2011-11-09 DIAGNOSIS — Z331 Pregnant state, incidental: Secondary | ICD-10-CM

## 2011-11-09 DIAGNOSIS — L509 Urticaria, unspecified: Secondary | ICD-10-CM

## 2011-11-09 DIAGNOSIS — Z3490 Encounter for supervision of normal pregnancy, unspecified, unspecified trimester: Secondary | ICD-10-CM

## 2011-11-09 LAB — POCT URINALYSIS DIP (DEVICE)
Bilirubin Urine: NEGATIVE
Hgb urine dipstick: NEGATIVE
Nitrite: NEGATIVE
pH: 6.5 (ref 5.0–8.0)

## 2011-11-09 MED ORDER — PREDNISONE 10 MG PO TABS: ORAL_TABLET | ORAL | Status: DC

## 2011-11-09 MED ORDER — HYDROXYZINE HCL 25 MG PO TABS: 25.0000 mg | ORAL_TABLET | Freq: Four times a day (QID) | ORAL | Status: DC

## 2011-11-09 MED ORDER — FAMOTIDINE 20 MG PO TABS: 20.0000 mg | ORAL_TABLET | Freq: Two times a day (BID) | ORAL | Status: DC

## 2011-11-09 NOTE — ED Notes (Signed)
Reports being treated for UTI approx 2-3 wks ago - completed course of abx (Keflex according to records).  On 9/21 started with intermittent nausea and vomiting that lasted approx 3 days, then improved.  Nausea continued some along with low-grade fevers around 100.3.  Noticed urine has bad smell again - similar to when she was diagnosed w/ UTI.  Denies any pain.  This morning woke up with generalized hives, and hives on palms of hands.  Denies any changes in lifestyle; states this happened approx 5-6 yrs ago "and they couldn't figure out why".  Took 50mg  Benadryl this morning at 1000 without any change; has also been taking Tylenol.

## 2011-11-09 NOTE — ED Provider Notes (Signed)
History     CSN: [id number]  Arrival date & time 11/09/11  1606   None     Chief Complaint  Patient presents with  . Nausea  . Fever  . Urticaria    (Consider location/radiation/quality/duration/timing/severity/associated sxs/prior treatment) Patient is a 26 y.o. female presenting with rash. The history is provided by the patient. No language interpreter was used.  Rash  This is a new problem. The problem is associated with nothing. There has been no fever. The rash is present on the abdomen and torso. The pain is at a severity of 0/10. The patient is experiencing no pain. The pain has been constant since onset. Associated symptoms include itching.  Pt has a history of hives.   Pt also reports menses is late.    Past Medical History  Diagnosis Date  . VSD (ventricular septal defect)   . Endometriosis     Past Surgical History  Procedure Date  . Cesarean section   . Tonsillectomy     Family History  Problem Relation Age of Onset  . Hyperlipidemia Mother   . Hyperthyroidism Mother   . Hyperlipidemia Father   . COPD Father   . Hypertension Father   . Birth defects Maternal Grandmother     thyroid cancer  . Birth defects Maternal Grandfather     prostate caner  . Birth defects Paternal Grandmother   . Diabetes Neg Hx   . Heart disease Neg Hx   . Stroke Neg Hx     History  Substance Use Topics  . Smoking status: Current Every Day Smoker -- 0.5 packs/day    Types: Cigarettes  . Smokeless tobacco: Not on file  . Alcohol Use: Yes     occasional    OB History    Grav Para Term Preterm Abortions TAB SAB Ect Mult Living                  Review of Systems  Skin: Positive for itching and rash.  All other systems reviewed and are negative.    Allergies  Aspirin  Home Medications   Current Outpatient Rx  Name Route Sig Dispense Refill  . NORGESTIMATE-ETH ESTRADIOL 0.25-35 MG-MCG PO TABS Oral Take 1 tablet by mouth daily. 1 Package 11    BP 104/69   Pulse 85  Temp 98.5 F (36.9 C) (Oral)  Resp 16  SpO2 99%  LMP 09/29/2011  Physical Exam  Vitals reviewed. Constitutional: She appears well-developed and well-nourished.  HENT:  Head: Normocephalic and atraumatic.  Nose: Nose normal.  Mouth/Throat: Oropharynx is clear and moist.  Eyes: Pupils are equal, round, and reactive to light.  Cardiovascular: Normal rate.   Pulmonary/Chest: Effort normal.  Musculoskeletal: Normal range of motion.  Neurological: She is alert.  Skin: Skin is warm.    ED Course  Procedures (including critical care time)  Labs Reviewed  POCT PREGNANCY, URINE - Abnormal; Notable for the following:    Preg Test, Ur POSITIVE (*)     All other components within normal limits  POCT URINALYSIS DIP (DEVICE)   No results found.   1. Pregnancy   2. Urticaria       MDM  Pepcid, benadryl and prednisone        Lonia Skinner [place], [place] 11/09/11 1856  Lonia Skinner [place], [place] 11/09/11 1857

## 2011-11-09 NOTE — Telephone Encounter (Signed)
Pt has been having nausea and threw up the first of the week and now has hives all over that are getting worse - she is at work and cannot get off but wants to speak to nurse to see what she should do   Or call  [phone]

## 2011-11-09 NOTE — Telephone Encounter (Signed)
Patient states she started with nausea and some vomiting last weekend and subsided on Monday of this week ( 09/23 ). Has had fever off and on this week. Now today has been breaking out with hives. Advised patient  she needs to be seen today. She will check with her boss and call me back.

## 2011-11-09 NOTE — ED Provider Notes (Signed)
Medical screening examination/treatment/procedure(s) were performed by non-physician practitioner and as supervising physician I was immediately available for consultation/collaboration.  Leslee Home, M.D.   Reuben Likes, MD 11/09/11 [phone]

## 2011-11-09 NOTE — Telephone Encounter (Signed)
Patient called back for appt to be seen today.  Scheduled appt for today on overflow clinic at 3:30pm.   Gaylene Brooks, RN

## 2011-11-28 ENCOUNTER — Ambulatory Visit: Payer: Self-pay | Admitting: Family Medicine

## 2011-11-28 ENCOUNTER — Encounter: Payer: Self-pay | Admitting: Family Medicine

## 2011-11-28 ENCOUNTER — Ambulatory Visit (INDEPENDENT_AMBULATORY_CARE_PROVIDER_SITE_OTHER): Payer: Self-pay | Admitting: Family Medicine

## 2011-11-28 VITALS — BP 118/72 | HR 87 | Ht 64.0 in | Wt 168.0 lb

## 2011-11-28 DIAGNOSIS — Z3201 Encounter for pregnancy test, result positive: Secondary | ICD-10-CM

## 2011-11-28 NOTE — Patient Instructions (Addendum)
Call us back once you have the Pregnancy Medicaid in place.   You can make a lab appointment at that time or have your labs drawn at your OB visit.    Make sure to start taking prenatal vitamins.  Try and cut back or totally quit smoking as well.  It was good to meet you today and congratulations!  Pregnancy - First Trimester During sexual intercourse, millions of sperm go into the vagina. Only 1 sperm will penetrate and fertilize the female egg while it is in the Fallopian tube. One week later, the fertilized egg implants into the wall of the uterus. An embryo begins to develop into a baby. At 6 to 8 weeks, the eyes and face are formed and the heartbeat can be seen on ultrasound. At the end of 12 weeks (first trimester), all the baby's organs are formed. Now that you are pregnant, you will want to do everything you can to have a healthy baby. Two of the most important things are to get good prenatal care and follow your caregiver's instructions. Prenatal care is all the medical care you receive before the baby's birth. It is given to prevent, find, and treat problems during the pregnancy and childbirth. PRENATAL EXAMS  During prenatal visits, your weight, blood pressure and urine are checked. This is done to make sure you are healthy and progressing normally during the pregnancy.  A pregnant woman should gain 25 to 35 pounds during the pregnancy. However, if you are over weight or underweight, your caregiver will advise you regarding your weight.  Your caregiver will ask and answer questions for you.  Blood work, cervical cultures, other necessary tests and a Pap test are done during your prenatal exams. These tests are done to check on your health and the probable health of your baby. Tests are strongly recommended and done for HIV with your permission. This is the virus that causes AIDS. These tests are done because medications can be given to help prevent your baby from being born with this  infection should you have been infected without knowing it. Blood work is also used to find out your blood type, previous infections and follow your blood levels (hemoglobin).  Low hemoglobin (anemia) is common during pregnancy. Iron and vitamins are given to help prevent this. Later in the pregnancy, blood tests for diabetes will be done along with any other tests if any problems develop. You may need tests to make sure you and the baby are doing well.  You may need other tests to make sure you and the baby are doing well. CHANGES DURING THE FIRST TRIMESTER (THE FIRST 3 MONTHS OF PREGNANCY) Your body goes through many changes during pregnancy. They vary from person to person. Talk to your caregiver about changes you notice and are concerned about. Changes can include:  Your menstrual period stops.  The egg and sperm carry the genes that determine what you look like. Genes from you and your partner are forming a baby. The female genes determine whether the baby is a boy or a girl.  Your body increases in girth and you may feel bloated.  Feeling sick to your stomach (nauseous) and throwing up (vomiting). If the vomiting is uncontrollable, call your caregiver.  Your breasts will begin to enlarge and become tender.  Your nipples may stick out more and become darker.  The need to urinate more. Painful urination may mean you have a bladder infection.  Tiring easily.  Loss of appetite.  Cravings for certain kinds of food.  At first, you may gain or lose a couple of pounds.  You may have changes in your emotions from day to day (excited to be pregnant or concerned something may go wrong with the pregnancy and baby).  You may have more vivid and strange dreams. HOME CARE INSTRUCTIONS   It is very important to avoid all smoking, alcohol and un-prescribed drugs during your pregnancy. These affect the formation and growth of the baby. Avoid chemicals while pregnant to ensure the delivery of a  healthy infant.  Start your prenatal visits by the 12th week of pregnancy. They are usually scheduled monthly at first, then more often in the last 2 months before delivery. Keep your caregiver's appointments. Follow your caregiver's instructions regarding medication use, blood and lab tests, exercise, and diet.  During pregnancy, you are providing food for you and your baby. Eat regular, well-balanced meals. Choose foods such as meat, fish, milk and other low fat dairy products, vegetables, fruits, and whole-grain breads and cereals. Your caregiver will tell you of the ideal weight gain.  You can help morning sickness by keeping soda crackers at the bedside. Eat a couple before arising in the morning. You may want to use the crackers without salt on them.  Eating 4 to 5 small meals rather than 3 large meals a day also may help the nausea and vomiting.  Drinking liquids between meals instead of during meals also seems to help nausea and vomiting.  A physical sexual relationship may be continued throughout pregnancy if there are no other problems. Problems may be early (premature) leaking of amniotic fluid from the membranes, vaginal bleeding, or belly (abdominal) pain.  Exercise regularly if there are no restrictions. Check with your caregiver or physical therapist if you are unsure of the safety of some of your exercises. Greater weight gain will occur in the last 2 trimesters of pregnancy. Exercising will help:  Control your weight.  Keep you in shape.  Prepare you for labor and delivery.  Help you lose your pregnancy weight after you deliver your baby.  Wear a good support or jogging bra for breast tenderness during pregnancy. This may help if worn during sleep too.  Ask when prenatal classes are available. Begin classes when they are offered.  Do not use hot tubs, steam rooms or saunas.  Wear your seat belt when driving. This protects you and your baby if you are in an  accident.  Avoid raw meat, uncooked cheese, cat litter boxes and soil used by cats throughout the pregnancy. These carry germs that can cause birth defects in the baby.  The first trimester is a good time to visit your dentist for your dental health. Getting your teeth cleaned is OK. Use a softer toothbrush and brush gently during pregnancy.  Ask for help if you have financial, counseling or nutritional needs during pregnancy. Your caregiver will be able to offer counseling for these needs as well as refer you for other special needs.  Do not take any medications or herbs unless told by your caregiver.  Inform your caregiver if there is any mental or physical domestic violence.  Make a list of emergency phone numbers of family, friends, hospital, and police and fire departments.  Write down your questions. Take them to your prenatal visit.  Do not douche.  Do not cross your legs.  If you have to stand for long periods of time, rotate you feet or take small steps  in a circle.  You may have more vaginal secretions that may require a sanitary pad. Do not use tampons or scented sanitary pads. MEDICATIONS AND DRUG USE IN PREGNANCY  Take prenatal vitamins as directed. The vitamin should contain 1 milligram of folic acid. Keep all vitamins out of reach of children. Only a couple vitamins or tablets containing iron may be fatal to a baby or young child when ingested.  Avoid use of all medications, including herbs, over-the-counter medications, not prescribed or suggested by your caregiver. Only take over-the-counter or prescription medicines for pain, discomfort, or fever as directed by your caregiver. Do not use aspirin, ibuprofen, or naproxen unless directed by your caregiver.  Let your caregiver also know about herbs you may be using.  Alcohol is related to a number of birth defects. This includes fetal alcohol syndrome. All alcohol, in any form, should be avoided completely. Smoking will  cause low birth rate and premature babies.  Street or illegal drugs are very harmful to the baby. They are absolutely forbidden. A baby born to an addicted mother will be addicted at birth. The baby will go through the same withdrawal an adult does.  Let your caregiver know about any medications that you have to take and for what reason you take them. MISCARRIAGE IS COMMON DURING PREGNANCY A miscarriage does not mean you did something wrong. It is not a reason to worry about getting pregnant again. Your caregiver will help you with questions you may have. If you have a miscarriage, you may need minor surgery. SEEK MEDICAL CARE IF:  You have any concerns or worries during your pregnancy. It is better to call with your questions if you feel they cannot wait, rather than worry about them. SEEK IMMEDIATE MEDICAL CARE IF:   An unexplained oral temperature above 102 F (38.9 C) develops, or as your caregiver suggests.  You have leaking of fluid from the vagina (birth canal). If leaking membranes are suspected, take your temperature and inform your caregiver of this when you call.  There is vaginal spotting or bleeding. Notify your caregiver of the amount and how many pads are used.  You develop a bad smelling vaginal discharge with a change in the color.  You continue to feel sick to your stomach (nauseated) and have no relief from remedies suggested. You vomit blood or coffee ground-like materials.  You lose more than 2 pounds of weight in 1 week.  You gain more than 2 pounds of weight in 1 week and you notice swelling of your face, hands, feet, or legs.  You gain 5 pounds or more in 1 week (even if you do not have swelling of your hands, face, legs, or feet).  You get exposed to [place] measles and have never had them.  You are exposed to fifth disease or chickenpox.  You develop belly (abdominal) pain. Round ligament discomfort is a common non-cancerous (benign) cause of abdominal pain in  pregnancy. Your caregiver still must evaluate this.  You develop headache, fever, diarrhea, pain with urination, or shortness of breath.  You fall or are in a car accident or have any kind of trauma.  There is mental or physical violence in your home. Document Released: 01/23/2001 Document Revised: 04/23/2011 Document Reviewed: 07/27/2008 [facility] Patient Information 2013 [place], [place].

## 2011-11-28 NOTE — Assessment & Plan Note (Signed)
Provided patient with letter from clinic giving her due date. She is 8.5 weeks today by gestational age. Patient states that LMP was August 16. Counseled quit smoking completely. She is to start taking prenatal vitamins as well. No red flags. She would like a female provider. We cannot do any labs today as she does not yet have pregnancy Medicaid.

## 2011-11-28 NOTE — Progress Notes (Signed)
  Subjective:    Patient ID: Kelsey [name], female    DOB: 10/20/1985, 26 y.o.   MRN: [id number]  HPI  1.  Follow up for positive pregnancy test:  G2P1001 who had + pregnancy test at Urgent Care 2-3 weeks ago.  She is following up with PCP as she was told to do.  PCP is Dr. Leveda Anna, she had resident here who performed her prior delivery.  Would like to follow up here for this pregnancy as well.  She has not yet obtained her pregnancy Medicaid.  She is continuing to smoke about one half pack per day. She is not taking prenatal vitamins. She has had some morning sickness describes nausea which is relieved by eating. She is holding down both food and by mouth fluids well. No actual vomiting. No abdominal pain. No vaginal bleeding or discharge. No dysuria. No fevers or chills.   Review of Systems See HPI above for review of systems.       Objective:   Physical Exam  Gen:  Alert, cooperative patient who appears stated age in no acute distress.  Vital signs reviewed. Cardiac:  Regular rate and rhythm without murmur auscultated.  Good S1/S2. Pulm:  Clear to auscultation bilaterally with good air movement.  No wheezes or rales noted.   Abd:  Soft/nondistended/nontender.  Good bowel sounds throughout all four quadrants.  No masses noted.  Gyn:  Deferred      Assessment & Plan:

## 2011-12-29 ENCOUNTER — Encounter (HOSPITAL_COMMUNITY): Payer: Self-pay | Admitting: *Deleted

## 2011-12-29 ENCOUNTER — Inpatient Hospital Stay (HOSPITAL_COMMUNITY)
Admission: AD | Admit: 2011-12-29 | Discharge: 2011-12-29 | Disposition: A | Discharge status: Home / Self Care | Payer: Self-pay | Source: Ambulatory Visit | Attending: Obstetrics & Gynecology | Admitting: Obstetrics & Gynecology

## 2011-12-29 DIAGNOSIS — R109 Unspecified abdominal pain: Secondary | ICD-10-CM | POA: Insufficient documentation

## 2011-12-29 DIAGNOSIS — N390 Urinary tract infection, site not specified: Secondary | ICD-10-CM | POA: Insufficient documentation

## 2011-12-29 DIAGNOSIS — O2340 Unspecified infection of urinary tract in pregnancy, unspecified trimester: Secondary | ICD-10-CM

## 2011-12-29 DIAGNOSIS — K5900 Constipation, unspecified: Secondary | ICD-10-CM | POA: Insufficient documentation

## 2011-12-29 DIAGNOSIS — O2390 Unspecified genitourinary tract infection in pregnancy, unspecified trimester: Secondary | ICD-10-CM | POA: Insufficient documentation

## 2011-12-29 HISTORY — DX: Headache: R51

## 2011-12-29 HISTORY — DX: Anxiety disorder, unspecified: F41.9

## 2011-12-29 HISTORY — DX: Depression, unspecified: F32.A

## 2011-12-29 HISTORY — DX: Major depressive disorder, single episode, unspecified: F32.9

## 2011-12-29 LAB — WET PREP, GENITAL: Yeast Wet Prep HPF POC: NONE SEEN

## 2011-12-29 LAB — URINALYSIS, ROUTINE W REFLEX MICROSCOPIC
Glucose, UA: NEGATIVE mg/dL
Hgb urine dipstick: NEGATIVE
Protein, ur: NEGATIVE mg/dL
pH: 6 (ref 5.0–8.0)

## 2011-12-29 LAB — URINE MICROSCOPIC-ADD ON

## 2011-12-29 MED ORDER — CEPHALEXIN 500 MG PO CAPS: 500.0000 mg | ORAL_CAPSULE | Freq: Three times a day (TID) | ORAL | Status: DC

## 2011-12-29 NOTE — MAU Provider Note (Signed)
History     CSN: [id number]  Arrival date and time: 12/29/11 1750   First Provider Initiated Contact with Patient 12/29/11 1914      Chief Complaint  Patient presents with  . Abdominal Pain   HPI Kelsey [name] is a 26 y.o. female @ [redacted]w[redacted]d weeks gestation who present to MAU with abdominal pain. The pain started today. She describes the pain as cramping like gas pain. The pain comes and goes. The pain is located on the left lower abdomen. She rates the pain as 5/10. She has had problems with constipation.   OB History    Grav Para Term Preterm Abortions TAB SAB Ect Mult Living   2 1 1  0 0 0 0 0 0 1      Past Medical History  Diagnosis Date  . VSD (ventricular septal defect)     born with  . Endometriosis   . Headache   . Asthma     as child  . Urinary tract infection   . Depression   . Anxiety     Past Surgical History  Procedure Date  . Cesarean section   . Tonsillectomy     Family History  Problem Relation Age of Onset  . Hyperlipidemia Mother   . Hyperthyroidism Mother   . Hyperlipidemia Father   . COPD Father   . Hypertension Father   . Birth defects Maternal Grandmother     thyroid cancer  . Birth defects Maternal Grandfather     prostate caner  . Birth defects Paternal Grandmother   . Diabetes Neg Hx   . Heart disease Neg Hx   . Stroke Neg Hx   . Other Neg Hx     History  Substance Use Topics  . Smoking status: Current Every Day Smoker -- 0.5 packs/day for 7 years    Types: Cigarettes  . Smokeless tobacco: Never Used  . Alcohol Use: Yes     Comment: occasiona; none with pregl    Allergies:  Allergies  Allergen Reactions  . Aspirin Other (See Comments)    HA, nausea, GI upset    Prescriptions prior to admission  Medication Sig Dispense Refill  . acetaminophen (TYLENOL) 500 MG tablet Take 500 mg by mouth every 6 (six) hours as needed. Head aches        Review of Systems  Constitutional: Negative for fever and chills.  Eyes:  Negative.   Respiratory: Negative for cough and wheezing.   Cardiovascular: Negative for chest pain.  Gastrointestinal: Positive for constipation. Negative for nausea, vomiting and diarrhea.  Genitourinary: Positive for urgency and frequency.  Musculoskeletal: Negative for back pain.  Skin: Negative for itching and rash.  Neurological: Negative for dizziness, seizures and headaches.  Psychiatric/Behavioral: Negative for depression. The patient is not nervous/anxious.    Physical Exam   Blood pressure 141/67, pulse 90, temperature 98.4 F (36.9 C), temperature source Oral, resp. rate 18, height 5' 3.5" (1.613 m), weight 171 lb (77.565 kg), last menstrual period 09/28/2011.  Physical Exam  Nursing note and vitals reviewed. Constitutional: She is oriented to person, place, and time. She appears well-developed and well-nourished. No distress.  HENT:  Head: Normocephalic and atraumatic.  Eyes: EOM are normal.  Neck: Neck supple.  Cardiovascular: Normal rate.   Respiratory: Effort normal.  GI: Soft. There is tenderness in the suprapubic area and left lower quadrant. There is no rigidity, no rebound, no guarding and no CVA tenderness.       Positive FHT's.  Genitourinary:       External genitalia without lesions. White discharge vaginal vault. Cervix long, closed, no CMT, no adnexal tenderness. Uterus consistent with dates.  Musculoskeletal: Normal range of motion.  Neurological: She is alert and oriented to person, place, and time.  Skin: Skin is warm and dry.  Psychiatric: She has a normal mood and affect. Her behavior is normal. Judgment and thought content normal.    Results for orders placed during the hospital encounter of 12/29/11 (from the past 24 hour(s))  URINALYSIS, ROUTINE W REFLEX MICROSCOPIC     Status: Abnormal   Collection Time   12/29/11  6:02 PM      Component Value Range   Color, Urine YELLOW  YELLOW   APPearance CLEAR  CLEAR   Specific Gravity, Urine 1.015  1.005  - 1.030   pH 6.0  5.0 - 8.0   Glucose, UA NEGATIVE  NEGATIVE mg/dL   Hgb urine dipstick NEGATIVE  NEGATIVE   Bilirubin Urine NEGATIVE  NEGATIVE   Ketones, ur NEGATIVE  NEGATIVE mg/dL   Protein, ur NEGATIVE  NEGATIVE mg/dL   Urobilinogen, UA 0.2  0.0 - 1.0 mg/dL   Nitrite POSITIVE (*) NEGATIVE   Leukocytes, UA NEGATIVE  NEGATIVE  URINE MICROSCOPIC-ADD ON     Status: Abnormal   Collection Time   12/29/11  6:02 PM      Component Value Range   Squamous Epithelial / LPF FEW (*) RARE   WBC, UA 0-2  <3 WBC/hpf   RBC / HPF 0-2  <3 RBC/hpf   Bacteria, UA MANY (*) RARE  WET PREP, GENITAL     Status: Abnormal   Collection Time   12/29/11  7:40 PM      Component Value Range   Yeast Wet Prep HPF POC NONE SEEN  NONE SEEN   Trich, Wet Prep NONE SEEN  NONE SEEN   Clue Cells Wet Prep HPF POC FEW (*) NONE SEEN   WBC, Wet Prep HPF POC FEW (*) NONE SEEN   Assessment: 26 y.o. female @ [redacted]w[redacted]d with abdominal pain   UTI   Constipation  Plan:  Urine sent for culture   Rx Keflex   Discussed diet and constipation  Discussed with the patient and all questioned fully answered. She will return if any problems arise.   Medication List     As of 12/29/2011  8:15 PM    START taking these medications         cephALEXin 500 MG capsule   Commonly known as: KEFLEX   Take 1 capsule (500 mg total) by mouth 3 (three) times daily.      CONTINUE taking these medications         acetaminophen 500 MG tablet   Commonly known as: TYLENOL          Where to get your medications    These are the prescriptions that you need to pick up. We sent them to a specific pharmacy, so you will need to go there to get them.   [facility] [id number], [place] - [address] AT [facility] OF [facility] [address]    [address] [place] [address]    Phone: [phone]    Hours: 24-hours        cephALEXin 500 MG capsule           MAU Course  Procedures  Tenlee Wollin, RN, FNP, [facility] 12/29/2011,  8:14 PM

## 2011-12-29 NOTE — MAU Note (Signed)
Pain at first thought it was just really bad gas, has gotten worse, now having burning in low back and cramping continues.  Has been constipated.

## 2012-01-13 NOTE — MAU Provider Note (Signed)
Attestation of Attending Supervision of Advanced Practitioner (CNM/NP): Evaluation and management procedures were performed by the Advanced Practitioner under my supervision and collaboration. I have reviewed the Advanced Practitioner's note and chart, and I agree with the management and plan.  Tray Klayman H. 1:32 PM   

## 2012-01-16 ENCOUNTER — Telehealth: Payer: Self-pay | Admitting: *Deleted

## 2012-01-16 MED ORDER — PRENATAL VITAMINS 28-0.8 MG PO TABS: 1.0000 | ORAL_TABLET | Freq: Every day | ORAL | Status: DC

## 2012-01-16 NOTE — Telephone Encounter (Signed)
Called and spoke to [facility] about [employer].  Still waiting to hear back from insurance.  Requesting Rx for prenatal vitamins. Prescription sent to [facility] on [address].  Myelle Poteat as soon as she hears back from [facility] to please call us so we can get her set up for prenatal care.  Ileana Ladd

## 2012-01-20 ENCOUNTER — Inpatient Hospital Stay (HOSPITAL_COMMUNITY)
Admission: AD | Admit: 2012-01-20 | Discharge: 2012-01-20 | Disposition: A | Discharge status: Hospice | Payer: Self-pay | Source: Ambulatory Visit | Attending: Obstetrics & Gynecology | Admitting: Obstetrics & Gynecology

## 2012-01-20 ENCOUNTER — Encounter (HOSPITAL_COMMUNITY): Payer: Self-pay | Admitting: *Deleted

## 2012-01-20 DIAGNOSIS — N949 Unspecified condition associated with female genital organs and menstrual cycle: Secondary | ICD-10-CM | POA: Insufficient documentation

## 2012-01-20 DIAGNOSIS — Z3490 Encounter for supervision of normal pregnancy, unspecified, unspecified trimester: Secondary | ICD-10-CM

## 2012-01-20 DIAGNOSIS — B373 Candidiasis of vulva and vagina: Secondary | ICD-10-CM

## 2012-01-20 DIAGNOSIS — O2390 Unspecified genitourinary tract infection in pregnancy, unspecified trimester: Secondary | ICD-10-CM | POA: Insufficient documentation

## 2012-01-20 DIAGNOSIS — R1032 Left lower quadrant pain: Secondary | ICD-10-CM | POA: Insufficient documentation

## 2012-01-20 DIAGNOSIS — B3731 Acute candidiasis of vulva and vagina: Secondary | ICD-10-CM | POA: Insufficient documentation

## 2012-01-20 LAB — WET PREP, GENITAL
Clue Cells Wet Prep HPF POC: NONE SEEN
Trich, Wet Prep: NONE SEEN
Yeast Wet Prep HPF POC: NONE SEEN

## 2012-01-20 LAB — URINALYSIS, ROUTINE W REFLEX MICROSCOPIC
Glucose, UA: NEGATIVE mg/dL
Hgb urine dipstick: NEGATIVE
Leukocytes, UA: NEGATIVE
Specific Gravity, Urine: 1.02 (ref 1.005–1.030)
pH: 7.5 (ref 5.0–8.0)

## 2012-01-20 MED ORDER — TERCONAZOLE 0.8 % VA CREA: 1.0000 | TOPICAL_CREAM | Freq: Every day | VAGINAL | Status: DC

## 2012-01-20 NOTE — MAU Note (Signed)
Pt c/o vaginal itching and irritation.  Says she treated with monistat one day, the day after thanksgiving and it seemed to go away for a short while but returned with a vengance

## 2012-01-20 NOTE — MAU Provider Note (Signed)
Attestation of Attending Supervision of Advanced Practitioner (CNM/NP): Evaluation and management procedures were performed by the Advanced Practitioner under my supervision and collaboration.  I have reviewed the Advanced Practitioner's note and chart, and I agree with the management and plan.  HARRAWAY-SMITH, Elsia Lasota 11:25 PM

## 2012-01-20 NOTE — MAU Note (Signed)
Patient states she has had a vaginal discharge for a while since she was treated for a UTI. Has self treated with minimal results Denies any bleeding. Has been having a mild lower left abdominal pain for a while and burning in lower back.

## 2012-01-20 NOTE — MAU Provider Note (Signed)
History     CSN: [id number]  Arrival date & time 01/20/12  1713   None     Chief Complaint  Patient presents with  . Vaginal Discharge  . Abdominal Pain    (Consider location/radiation/quality/duration/timing/severity/associated sxs/prior treatment) HPI Kelsey [name] is a 26 y.o. G2P1001 at [redacted]w[redacted]d. She had UTI tx with Keflex ~ 3 wks go. She started having yeast symptoms after, tx with OTC Monistat with good relief.  She started having white chunky discharge a few days ago, worse today.Has itching, burning.  Has continued low abd pain, mostly LLQ. Has sharp low back pain with movement off/on. Needs preg verification letter to complete Medicaid application.   Past Medical History  Diagnosis Date  . VSD (ventricular septal defect)     born with  . Endometriosis   . Headache   . Asthma     as child  . Urinary tract infection   . Depression   . Anxiety     Past Surgical History  Procedure Date  . Cesarean section   . Tonsillectomy   . Wisdom tooth extraction     Family History  Problem Relation Age of Onset  . Hyperlipidemia Mother   . Hyperthyroidism Mother   . Hyperlipidemia Father   . COPD Father   . Hypertension Father   . Birth defects Maternal Grandmother     thyroid cancer  . Cancer Maternal Grandmother   . Birth defects Maternal Grandfather     prostate caner  . Birth defects Paternal Grandmother   . Diabetes Neg Hx   . Stroke Neg Hx   . Other Neg Hx   . Heart disease Paternal Uncle   . Cancer Paternal Grandfather     History  Substance Use Topics  . Smoking status: Current Every Day Smoker -- 0.5 packs/day for 7 years    Types: Cigarettes  . Smokeless tobacco: Never Used  . Alcohol Use: Yes     Comment: occasiona; none with preg    OB History    Grav Para Term Preterm Abortions TAB SAB Ect Mult Living   2 1 1  0 0 0 0 0 0 1      Review of Systems  Constitutional: Negative for fever and chills.  Gastrointestinal: Positive for nausea.  Negative for vomiting, diarrhea and constipation.  Genitourinary: Positive for frequency, vaginal discharge and pelvic pain. Negative for dysuria, urgency and vaginal bleeding.    Allergies  Aspirin  Home Medications  No current outpatient prescriptions on file.  BP 128/74  Pulse 89  Temp 98.4 F (36.9 C) (Oral)  Resp 16  Ht 5\' 5"  (1.651 m)  Wt 172 lb 3.2 oz (78.109 kg)  BMI 28.66 kg/m2  SpO2 100%  LMP 09/24/2011  Physical Exam  Constitutional: She is oriented to person, place, and time. She appears well-developed and well-nourished.  Abdominal: Soft. There is no tenderness.  Genitourinary:       Pelvic: Vulva- nl anatomy, erythema Vagina- large amt thick chunky white discharge  Musculoskeletal: Normal range of motion.  Neurological: She is alert and oriented to person, place, and time.  Skin: Skin is warm and dry.  Psychiatric: She has a normal mood and affect. Her behavior is normal.    ED Course  Procedures (including critical care time)  Labs Reviewed  URINALYSIS, ROUTINE W REFLEX MICROSCOPIC - Abnormal; Notable for the following:    APPearance CLOUDY (*)     All other components within normal limits  WET PREP,  GENITAL   No results found. Results for orders placed during the hospital encounter of 01/20/12 (from the past 24 hour(s))  URINALYSIS, ROUTINE W REFLEX MICROSCOPIC     Status: Abnormal   Collection Time   01/20/12  5:32 PM      Component Value Range   Color, Urine YELLOW  YELLOW   APPearance CLOUDY (*) CLEAR   Specific Gravity, Urine 1.020  1.005 - 1.030   pH 7.5  5.0 - 8.0   Glucose, UA NEGATIVE  NEGATIVE mg/dL   Hgb urine dipstick NEGATIVE  NEGATIVE   Bilirubin Urine NEGATIVE  NEGATIVE   Ketones, ur NEGATIVE  NEGATIVE mg/dL   Protein, ur NEGATIVE  NEGATIVE mg/dL   Urobilinogen, UA 0.2  0.0 - 1.0 mg/dL   Nitrite NEGATIVE  NEGATIVE   Leukocytes, UA NEGATIVE  NEGATIVE  WET PREP, GENITAL     Status: Abnormal   Collection Time   01/20/12  6:00 PM       Component Value Range   Yeast Wet Prep HPF POC NONE SEEN  NONE SEEN   Trich, Wet Prep NONE SEEN  NONE SEEN   Clue Cells Wet Prep HPF POC NONE SEEN  NONE SEEN   WBC, Wet Prep HPF POC FEW (*) NONE SEEN     No diagnosis found.    MDM  ASSESSMENT:  16 + wks symptomatic yeast  PLAN:   Terazol or OTC Monistat Preg verification letter to pt for Medicaid application Make an appt for prenatal care when gets insurance benefits

## 2012-02-20 ENCOUNTER — Other Ambulatory Visit: Payer: Self-pay

## 2012-02-21 ENCOUNTER — Other Ambulatory Visit: Payer: Self-pay | Admitting: Obstetrics and Gynecology

## 2012-02-21 LAB — OB RESULTS CONSOLE ANTIBODY SCREEN: Antibody Screen: NEGATIVE

## 2012-02-21 LAB — OB RESULTS CONSOLE RPR: RPR: NONREACTIVE

## 2012-02-27 ENCOUNTER — Encounter: Payer: Self-pay | Admitting: Sports Medicine

## 2012-06-09 ENCOUNTER — Other Ambulatory Visit: Payer: Self-pay | Admitting: Obstetrics and Gynecology

## 2012-06-19 ENCOUNTER — Encounter (HOSPITAL_COMMUNITY): Payer: Self-pay | Admitting: Pharmacy Technician

## 2012-06-27 ENCOUNTER — Encounter (HOSPITAL_COMMUNITY): Payer: Self-pay

## 2012-06-28 ENCOUNTER — Inpatient Hospital Stay (HOSPITAL_COMMUNITY)
Admission: AD | Admit: 2012-06-28 | Discharge: 2012-06-28 | Disposition: A | Discharge status: Home / Self Care | Payer: BC Managed Care – PPO | Source: Ambulatory Visit | Attending: Obstetrics and Gynecology | Admitting: Obstetrics and Gynecology

## 2012-06-28 ENCOUNTER — Encounter (HOSPITAL_COMMUNITY): Payer: Self-pay | Admitting: *Deleted

## 2012-06-28 DIAGNOSIS — Z98891 History of uterine scar from previous surgery: Secondary | ICD-10-CM

## 2012-06-28 DIAGNOSIS — O34219 Maternal care for unspecified type scar from previous cesarean delivery: Secondary | ICD-10-CM | POA: Insufficient documentation

## 2012-06-28 DIAGNOSIS — O479 False labor, unspecified: Secondary | ICD-10-CM | POA: Insufficient documentation

## 2012-06-28 MED ORDER — LACTATED RINGERS IV SOLN
Freq: Once | INTRAVENOUS | Status: AC
  Administered 2012-06-28: 03:00:00 via INTRAVENOUS

## 2012-06-28 MED ORDER — TERBUTALINE SULFATE 1 MG/ML IJ SOLN
0.2500 mg | Freq: Once | INTRAMUSCULAR | Status: AC
  Administered 2012-06-28: 0.25 mg via SUBCUTANEOUS

## 2012-06-28 MED ORDER — TERBUTALINE SULFATE 1 MG/ML IJ SOLN
INTRAMUSCULAR | Status: AC
  Filled 2012-06-28: qty 1

## 2012-06-28 NOTE — MAU Note (Signed)
Contractions since 2115 and 2130. For repeat C/S 5/20. Leaking watery d/c for 2-3 days

## 2012-06-28 NOTE — MAU Note (Addendum)
PT SAYS SHE STARTED HURTING BAD AT 2300.      PT HAD FIRST C/S FOR FETAL DISTRESS.    THIS IS REPEAT C/S.   GBS-  NEG.  DENIES HSV AND MRSA.   C/S  [facility] ON Tuesday  5-20.   LAST APPOINTMENT -   5-13-  NO VE.

## 2012-06-29 ENCOUNTER — Inpatient Hospital Stay (HOSPITAL_COMMUNITY)
Admission: AD | Admit: 2012-06-29 | Discharge: 2012-06-30 | Disposition: A | Discharge status: Home / Self Care | Payer: BC Managed Care – PPO | Source: Ambulatory Visit | Attending: Obstetrics and Gynecology | Admitting: Obstetrics and Gynecology

## 2012-06-29 ENCOUNTER — Encounter (HOSPITAL_COMMUNITY): Payer: Self-pay | Admitting: *Deleted

## 2012-06-29 DIAGNOSIS — O479 False labor, unspecified: Secondary | ICD-10-CM | POA: Insufficient documentation

## 2012-06-29 MED ORDER — TERBUTALINE SULFATE 1 MG/ML IJ SOLN
0.2500 mg | Freq: Once | INTRAMUSCULAR | Status: AC
  Administered 2012-06-30: 0.25 mg via SUBCUTANEOUS
  Filled 2012-06-29: qty 1

## 2012-06-29 NOTE — MAU Note (Signed)
Pt. Was here on 5/17 with uc's and sent home without cervical change.  Pt. Back with more intense uc's and closer together. No bleeding and leaking of fluid.

## 2012-06-29 NOTE — MAU Note (Signed)
Contractions  

## 2012-06-30 ENCOUNTER — Encounter (HOSPITAL_COMMUNITY): Payer: Self-pay

## 2012-06-30 ENCOUNTER — Encounter (HOSPITAL_COMMUNITY)
Admission: RE | Admit: 2012-06-30 | Discharge: 2012-06-30 | Disposition: A | Discharge status: Home / Self Care | Payer: BC Managed Care – PPO | Source: Ambulatory Visit | Attending: Obstetrics and Gynecology | Admitting: Obstetrics and Gynecology

## 2012-06-30 LAB — ABO/RH: ABO/RH(D): AB POS

## 2012-06-30 LAB — CBC
HCT: 36.9 % (ref 36.0–46.0)
Hemoglobin: 12.8 g/dL (ref 12.0–15.0)
RBC: 3.9 MIL/uL (ref 3.87–5.11)
WBC: 15.5 10*3/uL — ABNORMAL HIGH (ref 4.0–10.5)

## 2012-06-30 NOTE — Patient Instructions (Addendum)
Your procedure is scheduled on:07/01/12  Enter through the Main Entrance at :1030 am Pick up desk phone and dial [phone] and inform us of your arrival.  Please call [phone] if you have any problems the morning of surgery.  Remember: Do not eat after midnight:tonight   DO NOT wear jewelry, eye make-up, lipstick,body lotion, or dark fingernail polish.   If you are to be admitted after surgery, leave suitcase in car until your room has been assigned.

## 2012-07-01 ENCOUNTER — Encounter (HOSPITAL_COMMUNITY): Payer: Self-pay | Admitting: Anesthesiology

## 2012-07-01 ENCOUNTER — Encounter (HOSPITAL_COMMUNITY)
Admission: AD | Disposition: A | Discharge status: Home / Self Care | Payer: Self-pay | Source: Ambulatory Visit | Attending: Obstetrics and Gynecology

## 2012-07-01 ENCOUNTER — Inpatient Hospital Stay (HOSPITAL_COMMUNITY): Payer: BC Managed Care – PPO | Admitting: Anesthesiology

## 2012-07-01 ENCOUNTER — Inpatient Hospital Stay (HOSPITAL_COMMUNITY)
Admission: AD | Admit: 2012-07-01 | Discharge: 2012-07-03 | DRG: 371 | Disposition: A | Discharge status: Home / Self Care | Payer: BC Managed Care – PPO | Source: Ambulatory Visit | Attending: Obstetrics and Gynecology | Admitting: Obstetrics and Gynecology

## 2012-07-01 DIAGNOSIS — O34219 Maternal care for unspecified type scar from previous cesarean delivery: Principal | ICD-10-CM | POA: Diagnosis present

## 2012-07-01 LAB — TYPE AND SCREEN
Antibody Screen: NEGATIVE
Antibody Screen: NEGATIVE

## 2012-07-01 SURGERY — Surgical Case
Anesthesia: Spinal | Site: Abdomen | Wound class: Clean Contaminated

## 2012-07-01 MED ORDER — NALBUPHINE SYRINGE 5 MG/0.5 ML
INJECTION | INTRAMUSCULAR | Status: AC
  Administered 2012-07-01: 5 mg via SUBCUTANEOUS
  Filled 2012-07-01: qty 1

## 2012-07-01 MED ORDER — NALOXONE HCL 1 MG/ML IJ SOLN
1.0000 ug/kg/h | INTRAVENOUS | Status: DC | PRN
  Filled 2012-07-01: qty 2

## 2012-07-01 MED ORDER — MEASLES, MUMPS & RUBELLA VAC ~~LOC~~ INJ
0.5000 mL | INJECTION | Freq: Once | SUBCUTANEOUS | Status: DC
  Filled 2012-07-01: qty 0.5

## 2012-07-01 MED ORDER — FENTANYL CITRATE 0.05 MG/ML IJ SOLN: 25.0000 ug | INTRAMUSCULAR | Status: DC | PRN

## 2012-07-01 MED ORDER — DIPHENHYDRAMINE HCL 50 MG/ML IJ SOLN: 12.5000 mg | INTRAMUSCULAR | Status: DC | PRN

## 2012-07-01 MED ORDER — SENNOSIDES-DOCUSATE SODIUM 8.6-50 MG PO TABS
2.0000 | ORAL_TABLET | Freq: Every day | ORAL | Status: DC
  Administered 2012-07-01 – 2012-07-02 (×2): 2 via ORAL

## 2012-07-01 MED ORDER — EPHEDRINE 5 MG/ML INJ
INTRAVENOUS | Status: AC
  Filled 2012-07-01: qty 10

## 2012-07-01 MED ORDER — PHENYLEPHRINE 40 MCG/ML (10ML) SYRINGE FOR IV PUSH (FOR BLOOD PRESSURE SUPPORT)
PREFILLED_SYRINGE | INTRAVENOUS | Status: AC
  Filled 2012-07-01: qty 5

## 2012-07-01 MED ORDER — CEFAZOLIN SODIUM-DEXTROSE 2-3 GM-% IV SOLR
INTRAVENOUS | Status: AC
  Filled 2012-07-01: qty 50

## 2012-07-01 MED ORDER — ONDANSETRON HCL 4 MG/2ML IJ SOLN
INTRAMUSCULAR | Status: DC | PRN
  Administered 2012-07-01: 4 mg via INTRAVENOUS

## 2012-07-01 MED ORDER — SODIUM CHLORIDE 0.9 % IJ SOLN: 3.0000 mL | INTRAMUSCULAR | Status: DC | PRN

## 2012-07-01 MED ORDER — OXYTOCIN 40 UNITS IN LACTATED RINGERS INFUSION - SIMPLE MED
INTRAVENOUS | Status: DC | PRN
  Administered 2012-07-01: 40 [IU] via INTRAVENOUS

## 2012-07-01 MED ORDER — CEFAZOLIN SODIUM-DEXTROSE 2-3 GM-% IV SOLR
2.0000 g | INTRAVENOUS | Status: AC
  Administered 2012-07-01: 2 g via INTRAVENOUS

## 2012-07-01 MED ORDER — METOCLOPRAMIDE HCL 5 MG/ML IJ SOLN: 10.0000 mg | Freq: Three times a day (TID) | INTRAMUSCULAR | Status: DC | PRN

## 2012-07-01 MED ORDER — FENTANYL CITRATE 0.05 MG/ML IJ SOLN
INTRAMUSCULAR | Status: AC
  Filled 2012-07-01: qty 2

## 2012-07-01 MED ORDER — MORPHINE SULFATE 0.5 MG/ML IJ SOLN
INTRAMUSCULAR | Status: AC
  Filled 2012-07-01: qty 10

## 2012-07-01 MED ORDER — SCOPOLAMINE 1 MG/3DAYS TD PT72: 1.0000 | MEDICATED_PATCH | Freq: Once | TRANSDERMAL | Status: DC

## 2012-07-01 MED ORDER — LACTATED RINGERS IV SOLN
INTRAVENOUS | Status: DC
  Administered 2012-07-02: 01:00:00 via INTRAVENOUS

## 2012-07-01 MED ORDER — PRENATAL MULTIVITAMIN CH: 1.0000 | ORAL_TABLET | Freq: Every day | ORAL | Status: DC

## 2012-07-01 MED ORDER — KETOROLAC TROMETHAMINE 30 MG/ML IJ SOLN
30.0000 mg | Freq: Four times a day (QID) | INTRAMUSCULAR | Status: DC | PRN
  Administered 2012-07-01: 30 mg via INTRAVENOUS
  Filled 2012-07-01: qty 1

## 2012-07-01 MED ORDER — NALBUPHINE HCL 10 MG/ML IJ SOLN
5.0000 mg | INTRAMUSCULAR | Status: DC | PRN
  Filled 2012-07-01: qty 1

## 2012-07-01 MED ORDER — NALOXONE HCL 0.4 MG/ML IJ SOLN: 0.4000 mg | INTRAMUSCULAR | Status: DC | PRN

## 2012-07-01 MED ORDER — FENTANYL CITRATE 0.05 MG/ML IJ SOLN
INTRAMUSCULAR | Status: DC | PRN
  Administered 2012-07-01: 25 ug via INTRATHECAL

## 2012-07-01 MED ORDER — DIPHENHYDRAMINE HCL 50 MG/ML IJ SOLN: 25.0000 mg | INTRAMUSCULAR | Status: DC | PRN

## 2012-07-01 MED ORDER — TETANUS-DIPHTH-ACELL PERTUSSIS 5-2.5-18.5 LF-MCG/0.5 IM SUSP
0.5000 mL | Freq: Once | INTRAMUSCULAR | Status: AC
  Administered 2012-07-01: 0.5 mL via INTRAMUSCULAR
  Filled 2012-07-01: qty 0.5

## 2012-07-01 MED ORDER — OXYTOCIN 10 UNIT/ML IJ SOLN
INTRAMUSCULAR | Status: AC
  Filled 2012-07-01: qty 4

## 2012-07-01 MED ORDER — ONDANSETRON HCL 4 MG/2ML IJ SOLN: 4.0000 mg | INTRAMUSCULAR | Status: DC | PRN

## 2012-07-01 MED ORDER — SIMETHICONE 80 MG PO CHEW
80.0000 mg | CHEWABLE_TABLET | Freq: Three times a day (TID) | ORAL | Status: DC
  Administered 2012-07-01 – 2012-07-02 (×5): 80 mg via ORAL

## 2012-07-01 MED ORDER — ONDANSETRON HCL 4 MG/2ML IJ SOLN
INTRAMUSCULAR | Status: AC
  Filled 2012-07-01: qty 2

## 2012-07-01 MED ORDER — SCOPOLAMINE 1 MG/3DAYS TD PT72
MEDICATED_PATCH | TRANSDERMAL | Status: AC
  Administered 2012-07-01: 1.5 mg via TRANSDERMAL
  Filled 2012-07-01: qty 1

## 2012-07-01 MED ORDER — EPHEDRINE SULFATE 50 MG/ML IJ SOLN
INTRAMUSCULAR | Status: DC | PRN
  Administered 2012-07-01: 5 mg via INTRAVENOUS

## 2012-07-01 MED ORDER — ZOLPIDEM TARTRATE 5 MG PO TABS: 5.0000 mg | ORAL_TABLET | Freq: Every evening | ORAL | Status: DC | PRN

## 2012-07-01 MED ORDER — MORPHINE SULFATE (PF) 0.5 MG/ML IJ SOLN
INTRAMUSCULAR | Status: DC | PRN
  Administered 2012-07-01: .15 mg via INTRATHECAL

## 2012-07-01 MED ORDER — PHENYLEPHRINE HCL 10 MG/ML IJ SOLN
INTRAMUSCULAR | Status: DC | PRN
  Administered 2012-07-01: 80 ug via INTRAVENOUS
  Administered 2012-07-01 (×2): 40 ug via INTRAVENOUS
  Administered 2012-07-01 (×2): 80 ug via INTRAVENOUS

## 2012-07-01 MED ORDER — WITCH HAZEL-GLYCERIN EX PADS
1.0000 "application " | MEDICATED_PAD | CUTANEOUS | Status: DC | PRN
  Administered 2012-07-03: 1 via TOPICAL

## 2012-07-01 MED ORDER — PRENATAL MULTIVITAMIN CH
1.0000 | ORAL_TABLET | Freq: Every day | ORAL | Status: DC
  Administered 2012-07-02 – 2012-07-03 (×2): 1 via ORAL
  Filled 2012-07-01 (×2): qty 1

## 2012-07-01 MED ORDER — LACTATED RINGERS IV SOLN
Freq: Once | INTRAVENOUS | Status: AC
  Administered 2012-07-01: 11:00:00 via INTRAVENOUS

## 2012-07-01 MED ORDER — LACTATED RINGERS IV SOLN
INTRAVENOUS | Status: DC | PRN
  Administered 2012-07-01: 12:00:00 via INTRAVENOUS

## 2012-07-01 MED ORDER — IBUPROFEN 600 MG PO TABS
600.0000 mg | ORAL_TABLET | Freq: Four times a day (QID) | ORAL | Status: DC
  Administered 2012-07-02 – 2012-07-03 (×7): 600 mg via ORAL
  Filled 2012-07-01 (×7): qty 1

## 2012-07-01 MED ORDER — OXYTOCIN 40 UNITS IN LACTATED RINGERS INFUSION - SIMPLE MED: 62.5000 mL/h | INTRAVENOUS | Status: AC

## 2012-07-01 MED ORDER — DIBUCAINE 1 % RE OINT
1.0000 "application " | TOPICAL_OINTMENT | RECTAL | Status: DC | PRN
  Administered 2012-07-03: 1 via RECTAL
  Filled 2012-07-01: qty 28

## 2012-07-01 MED ORDER — OXYCODONE-ACETAMINOPHEN 5-325 MG PO TABS
1.0000 | ORAL_TABLET | ORAL | Status: DC | PRN
  Administered 2012-07-02 – 2012-07-03 (×5): 1 via ORAL
  Filled 2012-07-01 (×5): qty 1

## 2012-07-01 MED ORDER — ONDANSETRON HCL 4 MG PO TABS: 4.0000 mg | ORAL_TABLET | ORAL | Status: DC | PRN

## 2012-07-01 MED ORDER — LACTATED RINGERS IV SOLN
INTRAVENOUS | Status: DC | PRN
  Administered 2012-07-01 (×3): via INTRAVENOUS

## 2012-07-01 MED ORDER — SCOPOLAMINE 1 MG/3DAYS TD PT72
1.0000 | MEDICATED_PATCH | Freq: Once | TRANSDERMAL | Status: DC
  Filled 2012-07-01: qty 1

## 2012-07-01 MED ORDER — ONDANSETRON HCL 4 MG/2ML IJ SOLN: 4.0000 mg | Freq: Three times a day (TID) | INTRAMUSCULAR | Status: DC | PRN

## 2012-07-01 MED ORDER — MEPERIDINE HCL 25 MG/ML IJ SOLN: 6.2500 mg | INTRAMUSCULAR | Status: DC | PRN

## 2012-07-01 MED ORDER — DIPHENHYDRAMINE HCL 25 MG PO CAPS
25.0000 mg | ORAL_CAPSULE | ORAL | Status: DC | PRN
  Administered 2012-07-02 (×2): 25 mg via ORAL
  Filled 2012-07-01 (×2): qty 1

## 2012-07-01 SURGICAL SUPPLY — 36 items
APL SKNCLS STERI-STRIP NONHPOA (GAUZE/BANDAGES/DRESSINGS) ×1
BENZOIN TINCTURE PRP APPL 2/3 (GAUZE/BANDAGES/DRESSINGS) ×1 IMPLANT
CLOTH BEACON ORANGE TIMEOUT ST (SAFETY) ×2 IMPLANT
CONTAINER PREFILL 10% NBF 15ML (MISCELLANEOUS) IMPLANT
DRAPE LG THREE QUARTER DISP (DRAPES) ×2 IMPLANT
DRSG OPSITE POSTOP 4X10 (GAUZE/BANDAGES/DRESSINGS) ×2 IMPLANT
DRSG OPSITE POSTOP 4X12 (GAUZE/BANDAGES/DRESSINGS) ×1 IMPLANT
DURAPREP 26ML APPLICATOR (WOUND CARE) ×2 IMPLANT
ELECT REM PT RETURN 9FT ADLT (ELECTROSURGICAL) ×2
ELECTRODE REM PT RTRN 9FT ADLT (ELECTROSURGICAL) ×1 IMPLANT
EXTRACTOR VACUUM M CUP 4 TUBE (SUCTIONS) IMPLANT
GLOVE ECLIPSE 7.0 STRL STRAW (GLOVE) ×4 IMPLANT
GLOVE ECLIPSE 7.5 STRL STRAW (GLOVE) ×1 IMPLANT
GOWN PREVENTION PLUS XLARGE (GOWN DISPOSABLE) ×2 IMPLANT
GOWN STRL REIN XL XLG (GOWN DISPOSABLE) ×4 IMPLANT
KIT ABG SYR 3ML LUER SLIP (SYRINGE) IMPLANT
NDL HYPO 25X5/8 SAFETYGLIDE (NEEDLE) IMPLANT
NEEDLE HYPO 25X5/8 SAFETYGLIDE (NEEDLE) IMPLANT
NS IRRIG 1000ML POUR BTL (IV SOLUTION) ×2 IMPLANT
PACK C SECTION WH (CUSTOM PROCEDURE TRAY) ×2 IMPLANT
PAD OB MATERNITY 4.3X12.25 (PERSONAL CARE ITEMS) ×2 IMPLANT
RETRACTOR WND ALEXIS 25 LRG (MISCELLANEOUS) IMPLANT
RETRACTOR WOUND ALXS 34CM XLRG (MISCELLANEOUS) IMPLANT
RTRCTR WOUND ALEXIS 25CM LRG (MISCELLANEOUS)
RTRCTR WOUND ALEXIS 34CM XLRG (MISCELLANEOUS)
STAPLER VISISTAT 35W (STAPLE) ×1 IMPLANT
STRIP CLOSURE SKIN 1/2X4 (GAUZE/BANDAGES/DRESSINGS) ×1 IMPLANT
SUT MNCRL 0 VIOLET CTX 36 (SUTURE) ×3 IMPLANT
SUT MON AB 2-0 CT1 27 (SUTURE) ×4 IMPLANT
SUT MONOCRYL 0 CTX 36 (SUTURE) ×3
SUT PDS AB 0 CTX 36 PDP370T (SUTURE) ×2 IMPLANT
SUT PLAIN 0 NONE (SUTURE) IMPLANT
SUT VICRYL RAPIDE 3 0 (SUTURE) ×1 IMPLANT
TOWEL OR 17X24 6PK STRL BLUE (TOWEL DISPOSABLE) ×6 IMPLANT
TRAY FOLEY CATH 14FR (SET/KITS/TRAYS/PACK) ×1 IMPLANT
WATER STERILE IRR 1000ML POUR (IV SOLUTION) ×2 IMPLANT

## 2012-07-01 NOTE — Anesthesia Preprocedure Evaluation (Addendum)
Anesthesia Evaluation  Patient identified by MRN, date of birth, ID band Patient awake    Reviewed: Allergy & Precautions, H&P , NPO status , Patient's Chart, lab work & pertinent test results  Airway Mallampati: II TM Distance: >3 FB Neck ROM: Full    Dental no notable dental hx.    Pulmonary asthma , Current Smoker,  breath sounds clear to auscultation  Pulmonary exam normal       Cardiovascular Rhythm:Regular Rate:Normal + Systolic murmurs Hx/o VSD almost closed now   Neuro/Psych  Headaches, PSYCHIATRIC DISORDERS Anxiety Depression ADHD   GI/Hepatic negative GI ROS, Neg liver ROS,   Endo/Other    Renal/GU negative Renal ROS  negative genitourinary   Musculoskeletal negative musculoskeletal ROS (+)   Abdominal   Peds  Hematology negative hematology ROS (+)   Anesthesia Other Findings Upper Orthodontic appliances  Reproductive/Obstetrics (+) Pregnancy Previous C/Section                           Anesthesia Physical Anesthesia Plan  ASA: II  Anesthesia Plan: Spinal   Post-op Pain Management:    Induction: Intravenous  Airway Management Planned: Natural Airway  Additional Equipment:   Intra-op Plan:   Post-operative Plan: Extubation in OR  Informed Consent: I have reviewed the patients History and Physical, chart, labs and discussed the procedure including the risks, benefits and alternatives for the proposed anesthesia with the patient or authorized representative who has indicated his/her understanding and acceptance.   Dental advisory given  Plan Discussed with: Anesthesiologist, CRNA and Surgeon  Anesthesia Plan Comments:         Anesthesia Quick Evaluation

## 2012-07-01 NOTE — Consult Note (Signed)
Neonatology Note:  Attendance at C-section:  I was asked by Dr. Anderson to attend this repeat C/S at term. The mother is a G2P1 AB pos, GBS not found with a history of depression. ROM at delivery, fluid clear. Infant vigorous with good spontaneous cry and tone. Needed only minimal bulb suctioning. Ap 8/9. Lungs clear to ausc in DR. To CN to care of Pediatrician.  Bernardo Brayman C. Rueben Kassim, MD  

## 2012-07-01 NOTE — Transfer of Care (Signed)
Immediate Anesthesia Transfer of Care Note  Patient: Kelsey [name]  Procedure(s) Performed: Procedure(s): Repeat CESAREAN SECTION (N/A)  Patient Location: PACU  Anesthesia Type:Spinal  Level of Consciousness: awake, alert  and oriented  Airway & Oxygen Therapy: Patient Spontanous Breathing  Post-op Assessment: Report given to PACU RN and Post -op Vital signs reviewed and stable  Post vital signs: Reviewed and stable  Complications: No apparent anesthesia complications

## 2012-07-01 NOTE — Anesthesia Procedure Notes (Signed)

## 2012-07-01 NOTE — H&P (Signed)
Pt is a 27 year old white female, G2P1001 at term who would like a repeat c section. The pt was offered a TOLAC and declined. PNC was uncomplicated. PMHx: see hollister PE: VSSAF        HEENT- wnl        ABD- gravid, verticle skin incision IMP/ IUP at term who desires a repeat c section PLAN/ Admit            Proceed with C section

## 2012-07-01 NOTE — Anesthesia Postprocedure Evaluation (Signed)
  Anesthesia Post-op Note  Patient: Kelsey [name]  Procedure(s) Performed: Procedure(s): Repeat CESAREAN SECTION (N/A)  Dosing of Epidural:  1st dose, through catheter ............................................Marland Kitchen epi 1:200K + Xylocaine 40 mg  2nd dose, through catheter, after waiting 3 minutes...Marland KitchenMarland Kitchenepi 1:200K + Xylocaine 60 mg    ( 2% Xylo charted as a single dose in Epic Meds for ease of charting; actual dosing was fractionated as above, for saftey's sake)  As each dose occurred, patient was free of IV sx; and patient exhibited no evidence of SA injection.  Patient is more comfortable after epidural dosed. Please see RN's note for documentation of vital signs,and FHR which are stable.  Patient reminded not to try to ambulate with numb legs, and that an RN must be present when she attempts to get up.

## 2012-07-02 ENCOUNTER — Encounter (HOSPITAL_COMMUNITY): Payer: Self-pay | Admitting: Obstetrics and Gynecology

## 2012-07-02 LAB — BIRTH TISSUE RECOVERY COLLECTION (PLACENTA DONATION)

## 2012-07-02 LAB — CBC
HCT: 32.6 % — ABNORMAL LOW (ref 36.0–46.0)
Hemoglobin: 11.1 g/dL — ABNORMAL LOW (ref 12.0–15.0)
MCHC: 34 g/dL (ref 30.0–36.0)
RBC: 3.4 MIL/uL — ABNORMAL LOW (ref 3.87–5.11)

## 2012-07-02 NOTE — Op Note (Signed)
NAMEAILEY, Kelsey [name] NO.:  [id number]  MEDICAL RECORD NO.:  [id number]  LOCATION:  9135                          FACILITY:  [facility]  PHYSICIAN:  Malva Limes, M.D.    DATE OF BIRTH:  26-Jul-1985  DATE OF PROCEDURE:  07/01/2012 DATE OF DISCHARGE:                              OPERATIVE REPORT   PREOPERATIVE DIAGNOSES: 1. Intrauterine pregnancy at term. 2. History of prior cesarean section. 3. The patient refused trial labor after cesarean section.  POSTOPERATIVE DIAGNOSES: 1. Intrauterine pregnancy at term. 2. History of prior cesarean section. 3. The patient refused trial labor after cesarean section.  PROCEDURE:  Repeat low transverse cesarean section.  SURGEON:  Malva Limes, MD  ASSISTANT:  Luvenia Redden, MD  ANESTHESIA:  Spinal.  ANTIBIOTICS:  Ancef 2 g.  ESTIMATED BLOOD LOSS:  900 mL.  SPECIMENS:  None.  COMPLICATIONS:  None.  FINDINGS:  The patient had normal fallopian tubes and ovaries bilaterally.  The uterine incision appeared to be intact.  DESCRIPTION OF PROCEDURE:  The patient was taken to the operating room where spinal anesthetic was administered without difficulty.  She was then placed in dorsal supine position with left lateral tilt.  Once an adequate level was reached, she was prepped and draped in the usual fashion for this procedure.  A Foley catheter was placed.  A vertical skin incision was made through the previous scar.  Once the fascia was reached, an incision was made and extended superiorly and inferiorly. The parietal peritoneum was entered sharply and taken superiorly and inferiorly.  The bladder flap was taken down sharply.  A low transverse uterine incision was made in the midline with Metzenbaum scissors and extended laterally with blunt dissection.  Amniotic fluid was noted to be lightly stained with meconium.  The infant was delivered in a vertex presentation.  On delivery of the head, the oropharynx and nostrils  were bulb suctioned.  There was a nuchal cord x1 which was reduced.  The cord was doubly clamped and cut after the infant was delivered.  The infant was handed to the awaiting NICU Team.  Cord blood was obtained.  The placenta was manually removed.  The uterus was exteriorized and uterine cavity width was wiped with a wet lap.  Uterine incision was closed in a single layer of 0 Monocryl suture in a running locking fashion.  The bladder flap was closed using 2-0 Monocryl suture in a running fashion. Hemostasis was rechecked and felt to be adequate.  The uterus was placed back in the abdominal cavity.  The vertical skin incision was closed using 0 PDS in a running Smead-Jones fashion.  The subcuticular tissue was made hemostatic with the Bovie and closed with interrupted 2-0 plain gut suture.  Stainless steel clip was used to close the skin.  The patient tolerated the procedure well.  She was taken to the recovery room in stable condition.  Instrument and lap counts were correct x3.          ______________________________ Malva Limes, M.D.     MA/MEDQ  D:  07/01/2012  T:  07/02/2012  Job:  [id number]

## 2012-07-02 NOTE — Anesthesia Postprocedure Evaluation (Signed)
  Anesthesia Post-op Note  Patient: Kelsey [name]  Procedure(s) Performed: Procedure(s): Repeat CESAREAN SECTION (N/A)  Patient Location: Mother/Baby  Anesthesia Type:Spinal  Level of Consciousness: awake, alert , oriented and patient cooperative  Airway and Oxygen Therapy: Patient Spontanous Breathing  Post-op Pain: mild  Post-op Assessment: Patient's Cardiovascular Status Stable, Respiratory Function Stable, No headache, No backache, No residual numbness and No residual motor weakness  Post-op Vital Signs: stable  Complications: No apparent anesthesia complications
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
## 2012-07-03 ENCOUNTER — Encounter (HOSPITAL_COMMUNITY)
Admission: RE | Admit: 2012-07-03 | Discharge: 2012-07-03 | Disposition: A | Discharge status: Home / Self Care | Payer: BC Managed Care – PPO | Source: Ambulatory Visit | Attending: Obstetrics and Gynecology | Admitting: Obstetrics and Gynecology

## 2012-07-03 DIAGNOSIS — O923 Agalactia: Secondary | ICD-10-CM | POA: Insufficient documentation

## 2012-07-03 MED ORDER — OXYCODONE-ACETAMINOPHEN 5-325 MG PO TABS: 2.0000 | ORAL_TABLET | ORAL | Status: DC | PRN

## 2012-07-03 MED ORDER — IBUPROFEN 600 MG PO TABS: 600.0000 mg | ORAL_TABLET | Freq: Four times a day (QID) | ORAL | Status: DC | PRN

## 2012-07-03 MED ORDER — DOCUSATE SODIUM 100 MG PO CAPS: 100.0000 mg | ORAL_CAPSULE | Freq: Two times a day (BID) | ORAL | Status: DC

## 2012-07-03 NOTE — Discharge Summary (Signed)
Obstetric Discharge Summary Reason for Admission: cesarean section Prenatal Procedures: ultrasound Intrapartum Procedures: cesarean: low cervical, transverse Postpartum Procedures: none Complications-Operative and Postpartum: none Hemoglobin  Date Value Range Status  07/02/2012 11.1* 12.0 - 15.0 g/dL Final     HCT  Date Value Range Status  07/02/2012 32.6* 36.0 - 46.0 % Final    Physical Exam:  General: alert, cooperative and appears stated age 27: appropriate Uterine Fundus: firm Incision: healing well DVT Evaluation: No evidence of DVT seen on physical exam.  Discharge Diagnoses: Term Pregnancy-delivered  Discharge Information: Date: 07/03/2012 Activity: pelvic rest Diet: routine Medications: Ibuprofen, Colace and Percocet Condition: improved Instructions: refer to practice specific booklet Discharge to: home Follow-up Information   Follow up with Levi Aland, MD In 1 week. (For a staple removal appointment)    Contact information:   [address] [place] [address] [phone]       Newborn Data: Live born female  Birth Weight: 6 lb 6.5 oz (2905 g) APGAR: 8, 9  Home with mother.  Sire Poet H. 07/03/2012, 9:25 AM

## 2012-07-03 NOTE — Plan of Care (Signed)
Problem: Discharge Progression Outcomes Goal: Remove staples per MD order Outcome: Not Applicable Date Met:  07/03/12 To be removed in 1 week at [facility] office

## 2012-07-08 ENCOUNTER — Ambulatory Visit (HOSPITAL_COMMUNITY): Admit: 2012-07-08 | Payer: BC Managed Care – PPO

## 2012-08-01 ENCOUNTER — Telehealth: Payer: Self-pay | Admitting: Family Medicine

## 2012-08-01 NOTE — Telephone Encounter (Signed)
Addendum to previous note:  Spoke with pt and Dr. Leveda Anna had referred her mother many years ago to have her hemorrhoids removed.  Instructed pt to call and schedule an appt with Dr. Leveda Anna to evaluate first.  Patient will find out from her mother the name of the MD to bring to the OV with Dr. Leveda Anna.  Arye Weyenberg, Darlyne Russian, CMA

## 2012-08-01 NOTE — Telephone Encounter (Signed)
Patient recently had a baby and is now having problems with Hemorrhoids and needs a referral to have them removed and is requesting a referral.

## 2012-08-01 NOTE — Telephone Encounter (Signed)
Will fwd to MD as to whether patient needs appt here

## 2012-08-03 ENCOUNTER — Encounter (HOSPITAL_COMMUNITY)
Admission: RE | Admit: 2012-08-03 | Discharge: 2012-08-03 | Disposition: A | Discharge status: Home / Self Care | Payer: BC Managed Care – PPO | Source: Ambulatory Visit | Attending: Obstetrics and Gynecology | Admitting: Obstetrics and Gynecology

## 2012-08-03 DIAGNOSIS — O923 Agalactia: Secondary | ICD-10-CM | POA: Insufficient documentation

## 2012-08-05 NOTE — Telephone Encounter (Signed)
Noted and agree. 

## 2012-08-20 ENCOUNTER — Ambulatory Visit (INDEPENDENT_AMBULATORY_CARE_PROVIDER_SITE_OTHER): Payer: BC Managed Care – PPO | Admitting: Family Medicine

## 2012-08-20 ENCOUNTER — Encounter: Payer: Self-pay | Admitting: Family Medicine

## 2012-08-20 VITALS — BP 122/66 | HR 94 | Temp 99.5°F | Ht 64.0 in | Wt 181.0 lb

## 2012-08-20 DIAGNOSIS — F17200 Nicotine dependence, unspecified, uncomplicated: Secondary | ICD-10-CM

## 2012-08-20 DIAGNOSIS — A630 Anogenital (venereal) warts: Secondary | ICD-10-CM

## 2012-08-20 DIAGNOSIS — K649 Unspecified hemorrhoids: Secondary | ICD-10-CM

## 2012-08-20 DIAGNOSIS — Z309 Encounter for contraceptive management, unspecified: Secondary | ICD-10-CM | POA: Insufficient documentation

## 2012-08-20 MED ORDER — IMIQUIMOD 5 % EX CREA: TOPICAL_CREAM | CUTANEOUS | Status: DC

## 2012-08-20 MED ORDER — NORGESTIMATE-ETH ESTRADIOL 0.25-35 MG-MCG PO TABS: 1.0000 | ORAL_TABLET | Freq: Every day | ORAL | Status: DC

## 2012-08-20 NOTE — Patient Instructions (Addendum)
Please quit smoking.  See Dr. Raymondo Band for smoking cessation advice/medications. See one of our female providers in the next couple of months for a pap smear. I sent in prescriptions for the aldera and birth control pills.   Please consider either you or your sweetee getting your tubes tied.

## 2012-08-20 NOTE — Assessment & Plan Note (Signed)
Explained risk of tobacco and BCP.  Advised to quit

## 2012-08-20 NOTE — Assessment & Plan Note (Signed)
Prefers female to do Pap.  Will give two months of BCP and have her return for PAP

## 2012-08-21 NOTE — Assessment & Plan Note (Signed)
Surgical referral per patient request.

## 2012-08-21 NOTE — Assessment & Plan Note (Signed)
Refill aldara 

## 2012-08-21 NOTE — Progress Notes (Signed)
  Subjective:    Patient ID: Kelsey [name], female    DOB: Jan 17, 1986, 27 y.o.   MRN: [id number]  HPI  Patient is here primarily for contraception.  She was not aware that she was due for a pap.  She prefers a female provider for that pap.  In discussing, she still has problems with condyloma and would like a refill on her alara cream Mood is good.   Wants refill on previous BCPs which she tolerated well. Discussed permanent sterilization in that she is certain that she does not want any more children.   Want surgical referral for hemorrhoids, which are much worse since last pregnancy.    Review of Systems     Objective:   Physical Exam Mood good Lungs clear Cardiac RRR without m or g         Assessment & Plan:

## 2012-09-02 ENCOUNTER — Ambulatory Visit (INDEPENDENT_AMBULATORY_CARE_PROVIDER_SITE_OTHER): Payer: BC Managed Care – PPO | Admitting: General Surgery

## 2012-09-02 ENCOUNTER — Encounter: Payer: Self-pay | Admitting: Family Medicine

## 2012-09-02 ENCOUNTER — Encounter (HOSPITAL_COMMUNITY)
Admission: RE | Admit: 2012-09-02 | Discharge: 2012-09-02 | Disposition: A | Discharge status: Home / Self Care | Payer: BC Managed Care – PPO | Source: Ambulatory Visit | Attending: Obstetrics and Gynecology | Admitting: Obstetrics and Gynecology

## 2012-09-02 DIAGNOSIS — K649 Unspecified hemorrhoids: Secondary | ICD-10-CM

## 2012-09-02 DIAGNOSIS — O923 Agalactia: Secondary | ICD-10-CM | POA: Insufficient documentation

## 2012-09-02 NOTE — Progress Notes (Signed)
Patient ID: Kelsey [name], female   DOB: Apr 12, 1985, 27 y.o.   MRN: [id number] [facility] for surgery referral.

## 2012-09-08 ENCOUNTER — Encounter (INDEPENDENT_AMBULATORY_CARE_PROVIDER_SITE_OTHER): Payer: Self-pay | Admitting: General Surgery

## 2012-09-23 ENCOUNTER — Ambulatory Visit (INDEPENDENT_AMBULATORY_CARE_PROVIDER_SITE_OTHER): Payer: BC Managed Care – PPO | Admitting: General Surgery

## 2012-10-02 ENCOUNTER — Other Ambulatory Visit: Payer: Self-pay | Admitting: Family Medicine

## 2012-10-03 ENCOUNTER — Encounter (HOSPITAL_COMMUNITY)
Admission: RE | Admit: 2012-10-03 | Discharge: 2012-10-03 | Disposition: A | Discharge status: Home / Self Care | Payer: BC Managed Care – PPO | Source: Ambulatory Visit | Attending: Obstetrics and Gynecology | Admitting: Obstetrics and Gynecology

## 2012-10-03 DIAGNOSIS — O923 Agalactia: Secondary | ICD-10-CM | POA: Insufficient documentation

## 2012-10-27 ENCOUNTER — Ambulatory Visit (INDEPENDENT_AMBULATORY_CARE_PROVIDER_SITE_OTHER): Payer: BC Managed Care – PPO | Admitting: General Surgery

## 2012-11-03 ENCOUNTER — Encounter (HOSPITAL_COMMUNITY)
Admission: RE | Admit: 2012-11-03 | Discharge: 2012-11-03 | Disposition: A | Discharge status: Home / Self Care | Payer: Medicaid Other | Source: Ambulatory Visit | Attending: Obstetrics and Gynecology | Admitting: Obstetrics and Gynecology

## 2012-11-03 DIAGNOSIS — O923 Agalactia: Secondary | ICD-10-CM | POA: Insufficient documentation

## 2012-11-04 ENCOUNTER — Encounter (INDEPENDENT_AMBULATORY_CARE_PROVIDER_SITE_OTHER): Payer: Self-pay | Admitting: General Surgery

## 2012-12-04 ENCOUNTER — Encounter (HOSPITAL_COMMUNITY)
Admission: RE | Admit: 2012-12-04 | Discharge: 2012-12-04 | Disposition: A | Discharge status: Home / Self Care | Payer: Medicaid Other | Source: Ambulatory Visit | Attending: Obstetrics and Gynecology | Admitting: Obstetrics and Gynecology

## 2012-12-04 DIAGNOSIS — O923 Agalactia: Secondary | ICD-10-CM | POA: Insufficient documentation

## 2013-01-05 ENCOUNTER — Encounter (HOSPITAL_COMMUNITY)
Admission: RE | Admit: 2013-01-05 | Discharge: 2013-01-05 | Disposition: A | Discharge status: Home / Self Care | Payer: Medicaid Other | Source: Ambulatory Visit | Attending: Obstetrics and Gynecology | Admitting: Obstetrics and Gynecology

## 2013-01-05 DIAGNOSIS — O923 Agalactia: Secondary | ICD-10-CM | POA: Insufficient documentation

## 2013-01-28 ENCOUNTER — Other Ambulatory Visit: Payer: Self-pay | Admitting: Family Medicine

## 2013-01-28 DIAGNOSIS — F17200 Nicotine dependence, unspecified, uncomplicated: Secondary | ICD-10-CM

## 2013-01-28 MED ORDER — VARENICLINE TARTRATE 0.5 MG X 11 & 1 MG X 42 PO MISC: ORAL | Status: DC

## 2013-01-28 NOTE — Assessment & Plan Note (Signed)
Comes in with kids.  Ready to quit.  Would like to try Chantix

## 2013-02-02 ENCOUNTER — Other Ambulatory Visit (HOSPITAL_COMMUNITY)
Admission: RE | Admit: 2013-02-02 | Discharge: 2013-02-02 | Disposition: A | Discharge status: Home / Self Care | Payer: Medicaid Other | Source: Ambulatory Visit | Attending: Family Medicine | Admitting: Family Medicine

## 2013-02-02 ENCOUNTER — Encounter: Payer: Self-pay | Admitting: Family Medicine

## 2013-02-02 ENCOUNTER — Ambulatory Visit (INDEPENDENT_AMBULATORY_CARE_PROVIDER_SITE_OTHER): Payer: Medicaid Other | Admitting: Family Medicine

## 2013-02-02 VITALS — BP 95/49 | HR 84 | Temp 99.3°F | Ht 64.0 in | Wt 174.0 lb

## 2013-02-02 DIAGNOSIS — Z3009 Encounter for other general counseling and advice on contraception: Secondary | ICD-10-CM

## 2013-02-02 DIAGNOSIS — Z113 Encounter for screening for infections with a predominantly sexual mode of transmission: Secondary | ICD-10-CM | POA: Insufficient documentation

## 2013-02-02 DIAGNOSIS — A630 Anogenital (venereal) warts: Secondary | ICD-10-CM

## 2013-02-02 DIAGNOSIS — Z124 Encounter for screening for malignant neoplasm of cervix: Secondary | ICD-10-CM

## 2013-02-02 DIAGNOSIS — Z01419 Encounter for gynecological examination (general) (routine) without abnormal findings: Secondary | ICD-10-CM | POA: Insufficient documentation

## 2013-02-02 DIAGNOSIS — N760 Acute vaginitis: Secondary | ICD-10-CM

## 2013-02-02 NOTE — Assessment & Plan Note (Signed)
Routine Pap smear performed today. Last Pap in 2011 was normal. 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 GC also completed today Will call patient with results

## 2013-02-02 NOTE — Patient Instructions (Signed)
I will call you with your Pap results when they come in. Make an appointment for the beginning of January in with Dr. Claiborne Billings for Nexplanon insertion. Here is some information on Nexplanon.    First remember that Nexplanon does not prevent sexually transmitted infections.  Condoms will help prevent sexually transmitted infections. The Nexplanon starts working 7 days after it was inserted.  There is a risk of getting pregnant if you have unprotected sex in those first 7 days after placement of the Nexplanon.  The Nexplanon lasts for 3 years but can be removed at any time.  You can become pregnant as early as 1 week after removal.  You can have a new Nexplanon put in after the old one is removed if you like.  It is not known whether Nexplanon is as effective in women who are very overweight because the studies did not include many overweight women.  Nexplanon interacts with some medications, including barbiturates, bosentan, carbamazepine, felbamate, griseofulvin, oxcarbazepine, phenytoin, rifampin, St. John's wort, topiramate, HIV medicines.  Please alert your doctor if you are on any of these medicines.  Always tell other healthcare providers that you have a Nexplanon in your arm.   The most common side effect is a change in your menstrual bleeding pattern.   This bleeding is generally not harmful to you but can be annoying. Some women have no periods, some have intermittent bleeding (unexpected spotting).

## 2013-02-02 NOTE — Progress Notes (Signed)
   Subjective:    Patient ID: Kelsey [name], female    DOB: Nov 12, 1985, 27 y.o.   MRN: [id number]  HPI  Well Women: Patient's last menstrual period was 01/07/2013. Periods occurring every ~30 days, last about 3-5 days. Currently on BCP. She is in  long term, 10 year relationship with a man. High Risk HPV positive. Normal PAP 2011 and prior.  [id number], last born this past May, both via C-section. No longer Breast feeding.   Contraception: Discussed with pt; She is not happy with the pill. She has been missing doses, and is not good at taking it on time when she does remember. She desires a long term contraception, that is reversible.  She is fearful of the IUD.   Review of Systems Negative, with the exception of above mentioned in HPI     Objective:   Physical Exam  Genitourinary:      BP 95/49  Pulse 84  Temp(Src) 99.3 F (37.4 C) (Oral)  Ht 5\' 4"  (1.626 m)  Wt 174 lb (78.926 kg)  BMI 29.85 kg/m2  LMP 01/07/2013 GYN:  External genitalia within normal limits, with the exception of 2 areas that appear like skin tags.  Vaginal mucosa pink, moist, normal rugae.  Nonfriable cervix without lesions, no discharge or bleeding noted on speculum exam.  Bimanual exam revealed normal, nongravid uterus.  No cervical motion tenderness. No adnexal masses bilaterally.     Procedure: Cryotherapy of above mentioned Genital lesions performed today. Pt tolerated well.

## 2013-02-02 NOTE — Assessment & Plan Note (Addendum)
In depth discussion over possible long-term reversible contraception available to patient. Patient has decided she would like to have the Nexplanon implant.  Shanda Bumps informed order for patient. Information on him thank given to patient. Discussion on irregular bleeding patterns being a possibility for her or no bleeding at all. Pt is ok with either.  Patient will make an appointment for beginning of January for insertion

## 2013-02-02 NOTE — Assessment & Plan Note (Signed)
2 genital warts removed by cryotherapy today, per patients request. She states the cream has not taken them completely away and she has had cryotherapy before.  Pt tolerated well.

## 2013-02-03 ENCOUNTER — Ambulatory Visit (INDEPENDENT_AMBULATORY_CARE_PROVIDER_SITE_OTHER): Payer: Medicaid Other | Admitting: General Surgery

## 2013-02-03 ENCOUNTER — Encounter (INDEPENDENT_AMBULATORY_CARE_PROVIDER_SITE_OTHER): Payer: Self-pay | Admitting: General Surgery

## 2013-02-03 VITALS — BP 118/64 | HR 92 | Temp 98.9°F | Resp 14 | Ht 64.0 in | Wt 173.6 lb

## 2013-02-03 DIAGNOSIS — K649 Unspecified hemorrhoids: Secondary | ICD-10-CM

## 2013-02-03 NOTE — Patient Instructions (Signed)
Plan for hemorrhoidectomy 

## 2013-02-03 NOTE — Progress Notes (Signed)
Patient ID: Kelsey [name], female   DOB: [birth year], 27 y.o.   MRN: [id number]  Chief Complaint  Patient presents with  . New Evaluation    eval hems    HPI Kelsey [name] is a 27 y.o. female.  We are asked to see the patient in consultation by Dr. Leveda Anna to evaluate her for hemorrhoids. The patient is a 27 year old white female who first developed hemorrhoids with her first child about 10 years ago. She recently gave birth to her son earlier this year and since that time has had issues with bleeding with her bowel movements. Her hemorrhoids were intermittently gets swollen and she'll have pain with this.  HPI  Past Medical History  Diagnosis Date  . VSD (ventricular septal defect)     born with  . Endometriosis   . Headache(784.0)   . Asthma     as child  . Urinary tract infection   . Depression   . Anxiety   . VSD (ventricular septal defect)     small VSD- asymptomatic  . Heart murmur     Past Surgical History  Procedure Laterality Date  . Cesarean section    . Tonsillectomy    . Wisdom tooth extraction    . Cesarean section N/A 07/01/2012    Procedure: Repeat CESAREAN SECTION;  Surgeon: Levi Aland, MD;  Location: [facility];  Service: Obstetrics;  Laterality: N/A;    Family History  Problem Relation Age of Onset  . Hyperlipidemia Mother   . Hyperthyroidism Mother   . Hyperlipidemia Father   . COPD Father   . Hypertension Father   . Birth defects Maternal Grandmother     thyroid cancer  . Cancer Maternal Grandmother   . Birth defects Maternal Grandfather     prostate caner  . Birth defects Paternal Grandmother   . Diabetes Neg Hx   . Stroke Neg Hx   . Other Neg Hx   . Heart disease Paternal Uncle   . Cancer Paternal Uncle     colon  . Cancer Paternal Grandfather     colon, prostate, and bone  . Cancer Sister     ovarian    Social History History  Substance Use Topics  . Smoking status: Current Every Day Smoker -- 0.50 packs/day for 7 years   Types: Cigarettes  . Smokeless tobacco: Never Used  . Alcohol Use: Yes     Comment: occasiona; none with preg    Allergies  Allergen Reactions  . Aspirin Nausea And Vomiting and Other (See Comments)    Headaches     Current Outpatient Prescriptions  Medication Sig Dispense Refill  . ibuprofen (ADVIL,MOTRIN) 600 MG tablet Take 1 tablet (600 mg total) by mouth every 6 (six) hours as needed for pain.  90 tablet  0  . imiquimod (ALDARA) 5 % cream Apply topically 3 (three) times a week.  12 each  0  . MONONESSA 0.25-35 MG-MCG tablet TAKE ONE TABLET BY MOUTH DAILY  28 tablet  12  . varenicline (CHANTIX PAK) 0.5 MG X 11 & 1 MG X 42 tablet Take one 0.5 mg tablet by mouth once daily for 3 days, then increase to one 0.5 mg tablet twice daily for 4 days, then increase to one 1 mg tablet twice daily.  53 tablet  0   No current facility-administered medications for this visit.    Review of Systems Review of Systems  Constitutional: Negative.   HENT: Negative.   Eyes: Negative.  Respiratory: Negative.   Cardiovascular: Negative.   Gastrointestinal: Negative.   Endocrine: Negative.   Genitourinary: Negative.   Musculoskeletal: Negative.   Skin: Negative.   Allergic/Immunologic: Negative.   Neurological: Negative.   Hematological: Negative.   Psychiatric/Behavioral: Negative.     Blood pressure 118/64, pulse 92, temperature 98.9 F (37.2 C), temperature source Temporal, resp. rate 14, height 5\' 4"  (1.626 m), weight 173 lb 9.6 oz (78.744 kg), last menstrual period 01/07/2013.  Physical Exam Physical Exam  Constitutional: She is oriented to person, place, and time. She appears well-developed and well-nourished.  HENT:  Head: Normocephalic and atraumatic.  Eyes: Conjunctivae and EOM are normal. Pupils are equal, round, and reactive to light.  Neck: Normal range of motion. Neck supple.  Cardiovascular: Normal rate, regular rhythm and normal heart sounds.   Pulmonary/Chest: Effort  normal and breath sounds normal.  Abdominal: Soft. Bowel sounds are normal.  Genitourinary:  The patient has 3 small to moderate sized external hemorrhoidal skin tags. Her skin otherwise looks healthy. She has good rectal tone and no palpable mass on digital exam. On anoscopic exam she has some irritation to the internal component of her posterior hemorrhoid but minimal other internal hemorrhoidal disease  Musculoskeletal: Normal range of motion.  Lymphadenopathy:    She has no cervical adenopathy.  Neurological: She is alert and oriented to person, place, and time.  Skin: Skin is warm and dry.  Psychiatric: She has a normal mood and affect. Her behavior is normal.    Data Reviewed As above  Assessment    The patient has both internal and external hemorrhoidal disease that causes her discomfort and bleeding. Because of this a figure be reasonable to remove these areas. I've discussed with her in detail the risks and benefits of hemorrhoidectomy as well as some of the technical aspects and she understands and wishes to proceed     Plan    Plan for a 2-3 column hemorrhoidectomy        TOTH III,PAUL S 02/03/2013, 9:58 AM

## 2013-02-06 ENCOUNTER — Telehealth: Payer: Self-pay | Admitting: Family Medicine

## 2013-02-06 ENCOUNTER — Encounter (HOSPITAL_COMMUNITY)
Admission: RE | Admit: 2013-02-06 | Discharge: 2013-02-06 | Disposition: A | Discharge status: Home / Self Care | Payer: Medicaid Other | Source: Ambulatory Visit | Attending: Obstetrics and Gynecology | Admitting: Obstetrics and Gynecology

## 2013-02-06 DIAGNOSIS — O923 Agalactia: Secondary | ICD-10-CM | POA: Insufficient documentation

## 2013-02-06 NOTE — Telephone Encounter (Signed)
Please call Kelsey [name] and let her know her PAP and Gonorrhea.Chlamydia cultures were negative. Thanks.

## 2013-02-09 NOTE — Telephone Encounter (Signed)
LVM for patient to call back. ?

## 2013-02-18 ENCOUNTER — Telehealth (INDEPENDENT_AMBULATORY_CARE_PROVIDER_SITE_OTHER): Payer: Self-pay | Admitting: General Surgery

## 2013-02-18 NOTE — Telephone Encounter (Signed)
Tried to reach patient to advise surgery for 02/25/13 has changed location and time but the telephone number is not allowing calls.  I have just sent her an e-mail but I am not sure that will work either.

## 2013-02-19 ENCOUNTER — Encounter (HOSPITAL_BASED_OUTPATIENT_CLINIC_OR_DEPARTMENT_OTHER): Payer: Self-pay | Admitting: *Deleted

## 2013-02-19 ENCOUNTER — Telehealth (INDEPENDENT_AMBULATORY_CARE_PROVIDER_SITE_OTHER): Payer: Self-pay

## 2013-02-19 ENCOUNTER — Other Ambulatory Visit (INDEPENDENT_AMBULATORY_CARE_PROVIDER_SITE_OTHER): Payer: Self-pay | Admitting: General Surgery

## 2013-02-19 NOTE — Pre-Procedure Instructions (Signed)
Cardiology notes/testing req. from [facility]

## 2013-02-19 NOTE — Telephone Encounter (Signed)
Kathy at [facility] called asking to verify if orders have been put in epic per Dr Billey Changoth's request. No pending orders in epic for upcoming surgery. I advised her I will send request to Dr Carolynne Edouardoth.

## 2013-02-20 ENCOUNTER — Ambulatory Visit (INDEPENDENT_AMBULATORY_CARE_PROVIDER_SITE_OTHER): Payer: Medicaid Other | Admitting: Family Medicine

## 2013-02-20 ENCOUNTER — Encounter: Payer: Self-pay | Admitting: Family Medicine

## 2013-02-20 VITALS — BP 127/54 | HR 82 | Temp 99.8°F | Ht 64.0 in | Wt 176.0 lb

## 2013-02-20 DIAGNOSIS — Z3009 Encounter for other general counseling and advice on contraception: Secondary | ICD-10-CM

## 2013-02-20 DIAGNOSIS — Z3046 Encounter for surveillance of implantable subdermal contraceptive: Secondary | ICD-10-CM

## 2013-02-20 DIAGNOSIS — Z975 Presence of (intrauterine) contraceptive device: Secondary | ICD-10-CM | POA: Insufficient documentation

## 2013-02-20 DIAGNOSIS — Z30017 Encounter for initial prescription of implantable subdermal contraceptive: Secondary | ICD-10-CM

## 2013-02-20 LAB — POCT URINE PREGNANCY: Preg Test, Ur: NEGATIVE

## 2013-02-20 MED ORDER — ETONOGESTREL 68 MG ~~LOC~~ IMPL
68.0000 mg | DRUG_IMPLANT | Freq: Once | SUBCUTANEOUS | Status: AC
  Administered 2013-02-20: 68 mg via SUBCUTANEOUS

## 2013-02-20 NOTE — Patient Instructions (Signed)
   Congratulations on getting your Nexplanon placement!  Below is some important information about Nexplanon.  First remember that Nexplanon does not prevent sexually transmitted infections.  Condoms will help prevent sexually transmitted infections. The Nexplanon starts working 7 days after it was inserted.  There is a risk of getting pregnant if you have unprotected sex in those first 7 days after placement of the Nexplanon.  The Nexplanon lasts for 3 years but can be removed at any time.  You can become pregnant as early as 1 week after removal.  You can have a new Nexplanon put in after the old one is removed if you like.  It is not known whether Nexplanon is as effective in women who are very overweight because the studies did not include many overweight women.  Nexplanon interacts with some medications, including barbiturates, bosentan, carbamazepine, felbamate, griseofulvin, oxcarbazepine, phenytoin, rifampin, St. John's wort, topiramate, HIV medicines.  Please alert your doctor if you are on any of these medicines.  Always tell other healthcare providers that you have a Nexplanon in your arm.  The Nexplanon was placed just under the skin.  Leave the outside bandage on for 24 hours.  Leave the smaller bandage on for 3-5 days or until it falls off on its own.  Keep the area clean and dry for 3-5 days. There is usually bruising or swelling at the insertion site for a few days to a week after placement.  If you see redness or pus draining from the insertion site, call us immediately.  Keep your user card with the date the implant was placed and the date the implant is to be removed.  The most common side effect is a change in your menstrual bleeding pattern.   This bleeding is generally not harmful to you but can be annoying.  Call or come in to see us if you have any concerns about the bleeding or if you have any side effects or questions.       

## 2013-02-20 NOTE — Addendum Note (Signed)
Addended by: Jone BasemanFLEEGER, Kyrus Hyde D on: 02/20/2013 04:12 PM   Modules accepted: Orders

## 2013-02-20 NOTE — Progress Notes (Signed)
   Subjective:    Patient ID: Kelsey [name], female    DOB: 01/22/1986, 28 y.o.   MRN: [id number]  HPI  Contraceptive implant: As discussed on prior appointment, pt is here for nexplanon contraception insertion.    Review of Systems Negative, with the exception of above mentioned in HPI     Objective:   Physical Exam  LMP 02/11/2013   HPI: Pt is here for Nexplanon insertion.   Concerns today: None  No contraindications for placement.  No liver disease, no unexplained vaginal bleeding, no h/o breast cancer, no h/o blood clots.  Patient's last menstrual period was 02/11/2013.  UHCG: Negative  Last Unprotected sex:  6 weeks prior to today  Risks & benefits of Nexplanon discussed The nexplanon device was [facility] and [facility] instructions supplied to patient Consent form signed  Current Outpatient Prescriptions on File Prior to Visit  Medication Sig Dispense Refill  . ibuprofen (ADVIL,MOTRIN) 600 MG tablet Take 1 tablet (600 mg total) by mouth every 6 (six) hours as needed for pain.  90 tablet  0  . varenicline (CHANTIX PAK) 0.5 MG X 11 & 1 MG X 42 tablet Take one 0.5 mg tablet by mouth once daily for 3 days, then increase to one 0.5 mg tablet twice daily for 4 days, then increase to one 1 mg tablet twice daily.  53 tablet  0   No current facility-administered medications on file prior to visit.    The patient denies any allergies to anesthetics or antiseptics.  Patient Active Problem List   Diagnosis Date Noted  . Insertion of implantable subdermal contraceptive 02/20/2013  . Routine Papanicolaou smear 02/02/2013  . General counseling and advice on female contraception 02/02/2013  . Unspecified contraceptive management 08/20/2012  . Hemorrhoids 08/20/2012  . History of cesarean delivery 06/28/2012  . 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 SINUSITIS, CHRONIC 07/09/2008  . TOBACCO ABUSE 01/31/2007  . DEPRESSIVE DISORDER, NOS 04/11/2006  . ATTENTION  DEFICIT, W/HYPERACTIVITY 04/11/2006  . VENTRICULAR SEPTAL DEFECT 04/11/2006    Procedure: Pt was placed in supine position. Left arm was flexed at the elbow and externally rotated so that her wrist was parallel to her ear The medial epicondyle of the left arm was identified The insertions site was marked 8 cm proximal to the medial epicondyle The insertion site was cleaned with Betadine The area surrounding the insertion site was covered with a sterile drape 1% lidocaine was injected just under the skin at the insertion site extending 4 cm proximally. The sterile preloaded disposable Nexaplanon applicator was removed from the sterile packaging The applicator needle was inserted at a 30 degree angle at 8 cm proximal to the medial epicondyle as marked The applicator was lowered to a horizontal position and advanced just under the skin for the full length of the needle The slider on the applicator was retracted fully while the applicator remained in the same position, then the applicator was removed. The implant was confirmed via palpation as being in position The implant position was demonstrated to the patient Pressure dressing was applied to the patient.  The patient was instructed to removed the pressure dressing in 24 hrs.  The patient was advised to move slowly from a supine to an upright position  The patient denied any concerns or complaints  The patient was instructed to call  to address any concerns.

## 2013-02-24 ENCOUNTER — Telehealth (INDEPENDENT_AMBULATORY_CARE_PROVIDER_SITE_OTHER): Payer: Self-pay

## 2013-02-24 NOTE — Progress Notes (Signed)
Dr. Sampson GoonFitzgerald reviewed Echo, EKG and cardiology note from 2009 - decided to cancel pt's surgery due to no follow up with cardiology since 2009.

## 2013-02-24 NOTE — Telephone Encounter (Signed)
Patient called back very irate and mad because her surgery for tomorrow is being cancelled. She explained this surgery has been rescheduled 3 times already. She has spent money on her bowel prep she took today. She doesn't understand why they wait until the day before surgery to decide they want cardiac clearance. I apologized and told her I was just the messenger; that it wasn't our office requesting cardiac clearance. She asked for the number to [facility] so she could contact them so I gave her their contact number.

## 2013-02-24 NOTE — Progress Notes (Signed)
Requested notes and any cardiac testing from Dr. Lovina ReachP Ross [facility] cardiology - spoke with Selena BattenKim.

## 2013-02-24 NOTE — Telephone Encounter (Signed)
Calling to notify us that Dr Sampson GoonFitzgerald is going to cancel the pt's surgery for 02/25/13 due to the pt having a diagnosis of VSD murmur and aneurysm flap that has not been followed since 2005 by Dr Dietrich PatesPaula [facility]. Dr Sampson GoonFitzgerald is requesting cardiac clearance on the pt before she has surgery. They requested we notify our schedulers and the pt.

## 2013-02-25 ENCOUNTER — Ambulatory Visit (HOSPITAL_BASED_OUTPATIENT_CLINIC_OR_DEPARTMENT_OTHER): Admission: RE | Admit: 2013-02-25 | Payer: Medicaid Other | Source: Ambulatory Visit | Admitting: General Surgery

## 2013-02-25 HISTORY — DX: Unspecified hemorrhoids: K64.9

## 2013-02-25 HISTORY — DX: Personal history of other diseases of the respiratory system: Z87.09

## 2013-02-25 SURGERY — HEMORRHOIDECTOMY
Anesthesia: General

## 2013-02-27 ENCOUNTER — Encounter: Payer: Medicaid Other | Admitting: Internal Medicine

## 2013-02-27 ENCOUNTER — Encounter: Payer: Self-pay | Admitting: *Deleted

## 2013-03-03 ENCOUNTER — Encounter: Payer: Self-pay | Admitting: Internal Medicine

## 2013-03-09 ENCOUNTER — Encounter (HOSPITAL_COMMUNITY)
Admission: RE | Admit: 2013-03-09 | Discharge: 2013-03-09 | Disposition: A | Discharge status: Home / Self Care | Payer: Medicaid Other | Source: Ambulatory Visit | Attending: Obstetrics and Gynecology | Admitting: Obstetrics and Gynecology

## 2013-03-09 DIAGNOSIS — O923 Agalactia: Secondary | ICD-10-CM | POA: Insufficient documentation

## 2013-03-10 ENCOUNTER — Encounter (INDEPENDENT_AMBULATORY_CARE_PROVIDER_SITE_OTHER): Payer: Medicaid Other | Admitting: General Surgery

## 2013-04-08 ENCOUNTER — Ambulatory Visit (HOSPITAL_COMMUNITY)
Admission: RE | Admit: 2013-04-08 | Discharge: 2013-04-08 | Disposition: A | Discharge status: Home / Self Care | Payer: Medicaid Other | Source: Ambulatory Visit | Attending: Obstetrics and Gynecology | Admitting: Obstetrics and Gynecology

## 2013-04-08 DIAGNOSIS — O923 Agalactia: Secondary | ICD-10-CM | POA: Insufficient documentation

## 2013-05-08 ENCOUNTER — Encounter (HOSPITAL_COMMUNITY)
Admission: RE | Admit: 2013-05-08 | Discharge: 2013-05-08 | Disposition: A | Discharge status: Home / Self Care | Payer: Medicaid Other | Source: Ambulatory Visit | Attending: Obstetrics and Gynecology | Admitting: Obstetrics and Gynecology

## 2013-05-08 DIAGNOSIS — O923 Agalactia: Secondary | ICD-10-CM | POA: Insufficient documentation

## 2013-06-08 ENCOUNTER — Encounter (HOSPITAL_COMMUNITY)
Admission: RE | Admit: 2013-06-08 | Discharge: 2013-06-08 | Disposition: A | Discharge status: Home / Self Care | Payer: Medicaid Other | Source: Ambulatory Visit | Attending: Obstetrics and Gynecology | Admitting: Obstetrics and Gynecology

## 2013-06-08 ENCOUNTER — Telehealth: Payer: Self-pay | Admitting: Family Medicine

## 2013-06-08 DIAGNOSIS — O923 Agalactia: Secondary | ICD-10-CM | POA: Insufficient documentation

## 2013-06-08 NOTE — Telephone Encounter (Signed)
Mother calls, concerned about patient. She has been having severe anxiety and depression and death in her family. [facility] but mother would like to speak to Dr. Leveda AnnaHensel or his nurses. Please call [phone]

## 2013-06-08 NOTE — Telephone Encounter (Signed)
Called mother, Kelsey [name].  Kelsey [name] phone #= [phone].  Called and message went straight to voice mail.  LM to have her call me.  Facts: Father Kelsey [name](Howard Ulbricht) died recently.  [name] and her father had a complicated relationship.;

## 2013-06-09 ENCOUNTER — Telehealth: Payer: Self-pay | Admitting: Family Medicine

## 2013-06-09 NOTE — Telephone Encounter (Signed)
Called and discussed.  Complicated grief over recent death of father.  Has been seen by mental health - set up counseling appointment but not until mid June.  I will see this week.  Also suggested [facility] at [facility].

## 2013-06-09 NOTE — Telephone Encounter (Signed)
Pt called and is returning Dr. Cyndia SkeetersHensel's call. Please call [phone]. Myriam Jacobsonjw

## 2013-06-10 ENCOUNTER — Ambulatory Visit (INDEPENDENT_AMBULATORY_CARE_PROVIDER_SITE_OTHER): Payer: Medicaid Other | Admitting: Family Medicine

## 2013-06-10 VITALS — BP 126/49 | HR 91 | Temp 98.1°F

## 2013-06-10 DIAGNOSIS — F4329 Adjustment disorder with other symptoms: Secondary | ICD-10-CM

## 2013-06-10 DIAGNOSIS — F3289 Other specified depressive episodes: Secondary | ICD-10-CM

## 2013-06-10 DIAGNOSIS — F329 Major depressive disorder, single episode, unspecified: Secondary | ICD-10-CM

## 2013-06-10 DIAGNOSIS — Z634 Disappearance and death of family member: Principal | ICD-10-CM

## 2013-06-10 DIAGNOSIS — F4321 Adjustment disorder with depressed mood: Secondary | ICD-10-CM

## 2013-06-10 NOTE — Progress Notes (Signed)
   Subjective:    Patient ID: Kelsey [name], female    DOB: [birth year], 28 y.o.   MRN: [id number]  HPI Depression: father died March 17.  Lost job in sept 14,  Lost home Oct 14.  Executor of the estate. Had complex relationship with father.  While he had many problems, he was also her main support.  Does not have support family, friends or even significant other, who is emotionally distant.  She feels very alone.  She has suicidal thoughts but would not act on them because she needs to be strong for her two young children.  [facility] and has been started on venlafaxine and trazodone.  She has counseling set up but not until mid June.   Left eye irritation.  12 hours only.  No visual changes.  Minimal clear drainage.      Review of Systems     Objective:   Physical Exam Sad affect and at time tearful.  Coherent throughout.    Eye, minimal conjunctival injection.  Clear media.       Assessment & Plan:

## 2013-06-11 ENCOUNTER — Encounter: Payer: Self-pay | Admitting: Family Medicine

## 2013-06-11 DIAGNOSIS — F4329 Adjustment disorder with other symptoms: Secondary | ICD-10-CM | POA: Insufficient documentation

## 2013-06-11 DIAGNOSIS — Z634 Disappearance and death of family member: Principal | ICD-10-CM

## 2013-06-11 DIAGNOSIS — F4321 Adjustment disorder with depressed mood: Secondary | ICD-10-CM | POA: Insufficient documentation

## 2013-06-11 NOTE — Patient Instructions (Signed)
Take your meds See the counseler as planned. Start making more connections, possibly with family, possibly with church See me in 2-3 weeks.

## 2013-06-11 NOTE — Assessment & Plan Note (Signed)
Much worse - complicated grief.

## 2013-06-11 NOTE — Assessment & Plan Note (Signed)
See History.  Recommend continued meds and counseling.  I will also provide some counseling given my longstanding relationship with patient and family.

## 2013-06-21 ENCOUNTER — Emergency Department (HOSPITAL_COMMUNITY)
Admission: EM | Admit: 2013-06-21 | Discharge: 2013-06-21 | Disposition: A | Discharge status: Home / Self Care | Payer: Medicaid Other | Source: Home / Self Care | Attending: Family Medicine | Admitting: Family Medicine

## 2013-06-21 ENCOUNTER — Encounter (HOSPITAL_COMMUNITY): Payer: Self-pay | Admitting: Emergency Medicine

## 2013-06-21 DIAGNOSIS — H109 Unspecified conjunctivitis: Secondary | ICD-10-CM

## 2013-06-21 MED ORDER — POLYMYXIN B-TRIMETHOPRIM 10000-0.1 UNIT/ML-% OP SOLN: 2.0000 [drp] | OPHTHALMIC | Status: DC

## 2013-06-21 NOTE — Discharge Instructions (Signed)
Thank you for coming in today. Use Polytrim antibiotic eyedrops for one week. Use over-the-counter Zaditor eyedrops (Ketotifen) twice daily as needed for itching.  Call or go to the emergency room if you get worse, have trouble breathing, have chest pains, or palpitations.    Conjunctivitis Conjunctivitis is commonly called "pink eye." Conjunctivitis can be caused by bacterial or viral infection, allergies, or injuries. There is usually redness of the lining of the eye, itching, discomfort, and sometimes discharge. There may be deposits of matter along the eyelids. A viral infection usually causes a watery discharge, while a bacterial infection causes a yellowish, thick discharge. Pink eye is very contagious and spreads by direct contact. You may be given antibiotic eyedrops as part of your treatment. Before using your eye medicine, remove all drainage from the eye by washing gently with warm water and cotton balls. Continue to use the medication until you have awakened 2 mornings in a row without discharge from the eye. Do not rub your eye. This increases the irritation and helps spread infection. Use separate towels from other household members. Wash your hands with soap and water before and after touching your eyes. Use cold compresses to reduce pain and sunglasses to relieve irritation from light. Do not wear contact lenses or wear eye makeup until the infection is gone. SEEK MEDICAL CARE IF:   Your symptoms are not better after 3 days of treatment.  You have increased pain or trouble seeing.  The outer eyelids become very red or swollen. Document Released: 03/08/2004 Document Revised: 04/23/2011 Document Reviewed: 01/29/2005 [facility] Patient Information 2014 [employer] [place].

## 2013-06-21 NOTE — ED Notes (Signed)
Pt c/o right eye pain onset 2 days ago. Pt reports she thought it was allergies and took Benadryl with no relief. Pt also has a nodule around her right ear that is tender to the touch. Eye has a mucous discharge. Pt is alert and oriented and in no acute distress.

## 2013-06-21 NOTE — ED Provider Notes (Signed)
Kelsey [name] is a 28 y.o. female who presents to Urgent Care today for right eye conjunctiva injection started 2 days ago. This is associated with mild discharge and irritation. No eye pain blurry vision or photophobia. No trauma. No fevers chills nausea vomiting or diarrhea. She's tried warm and cold compress as well as antihistamine eyedrops and saline. These do not help very much.   Past Medical History  Diagnosis Date  . VSD (ventricular septal defect)     small VSD- asymptomatic  . Headache(784.0)     occasional migraines  . History of depression     no current problems  . History of asthma     as a child  . Hemorrhoids   . Heart murmur     VSD - states is size of a pin hole; no known problems; only sees cardiology every 2-3 years   History  Substance Use Topics  . Smoking status: Current Every Day Smoker -- 0.50 packs/day for 9 years    Types: Cigarettes  . Smokeless tobacco: Never Used  . Alcohol Use: Yes     Comment: socially   ROS as above Medications: No current facility-administered medications for this encounter.   Current Outpatient Prescriptions  Medication Sig Dispense Refill  . etonogestrel (IMPLANON) 68 MG IMPL implant Inject 1 each into the skin once. Inserted Jan 2015      . ibuprofen (ADVIL,MOTRIN) 600 MG tablet Take 1 tablet (600 mg total) by mouth every 6 (six) hours as needed for pain.  90 tablet  0  . traZODone (DESYREL) 50 MG tablet Take 50 mg by mouth at bedtime. Per mental health      . trimethoprim-polymyxin b (POLYTRIM) ophthalmic solution Place 2 drops into the right eye every 4 (four) hours.  10 mL  0  . Venlafaxine HCl 75 MG TB24 Take 150 mg by mouth daily.        Exam:  BP 121/83  Pulse 77  Temp(Src) 98.6 F (37 C) (Oral)  Resp 16  SpO2 100% Gen: Well NAD HEENT: EOMI,  MMM right eye conjunctival injection with mild discharge. PERRLA bilaterally. Pain-free range of motion.  No results found for this or any previous visit (from the  past 24 hour(s)). No results found.  Assessment and Plan: 28 y.o. female with conjunctivitis of the right eye. Viral versus bacterial. Treatment with Polytrim. Additionally use Zaditor eye drops.  Discussed warning signs or symptoms. Please see discharge instructions. Patient expresses understanding.    Rodolph BongEvan S Johonna Binette, MD 06/21/13 1435

## 2013-07-03 ENCOUNTER — Other Ambulatory Visit: Payer: Self-pay | Admitting: Family Medicine

## 2013-07-03 MED ORDER — VENLAFAXINE HCL ER 75 MG PO TB24: 75.0000 mg | ORAL_TABLET | Freq: Every day | ORAL | Status: DC

## 2013-07-08 ENCOUNTER — Encounter (HOSPITAL_COMMUNITY)
Admission: RE | Admit: 2013-07-08 | Discharge: 2013-07-08 | Disposition: A | Discharge status: Home / Self Care | Payer: Medicaid Other | Source: Ambulatory Visit | Attending: Obstetrics and Gynecology | Admitting: Obstetrics and Gynecology

## 2013-07-08 DIAGNOSIS — O923 Agalactia: Secondary | ICD-10-CM | POA: Insufficient documentation

## 2013-08-08 ENCOUNTER — Encounter (HOSPITAL_COMMUNITY)
Admission: RE | Admit: 2013-08-08 | Discharge: 2013-08-08 | Disposition: A | Discharge status: Home / Self Care | Payer: Medicaid Other | Source: Ambulatory Visit | Attending: Obstetrics and Gynecology | Admitting: Obstetrics and Gynecology

## 2013-08-08 DIAGNOSIS — O923 Agalactia: Secondary | ICD-10-CM | POA: Insufficient documentation

## 2013-09-07 ENCOUNTER — Encounter (HOSPITAL_COMMUNITY)
Admission: RE | Admit: 2013-09-07 | Discharge: 2013-09-07 | Disposition: A | Discharge status: Home / Self Care | Payer: Medicaid Other | Source: Ambulatory Visit | Attending: Obstetrics and Gynecology | Admitting: Obstetrics and Gynecology

## 2013-09-07 DIAGNOSIS — O923 Agalactia: Secondary | ICD-10-CM | POA: Diagnosis present

## 2013-10-08 ENCOUNTER — Encounter (HOSPITAL_COMMUNITY)
Admission: RE | Admit: 2013-10-08 | Discharge: 2013-10-08 | Disposition: A | Discharge status: Home / Self Care | Payer: Medicaid Other | Source: Ambulatory Visit | Attending: Obstetrics and Gynecology | Admitting: Obstetrics and Gynecology

## 2013-10-08 DIAGNOSIS — O923 Agalactia: Secondary | ICD-10-CM | POA: Insufficient documentation

## 2013-12-14 ENCOUNTER — Encounter (HOSPITAL_COMMUNITY): Payer: Self-pay | Admitting: Emergency Medicine

## 2013-12-30 ENCOUNTER — Encounter: Payer: Self-pay | Admitting: General Practice

## 2014-04-02 ENCOUNTER — Encounter: Payer: Self-pay | Admitting: Family Medicine

## 2014-04-02 ENCOUNTER — Telehealth: Payer: Self-pay | Admitting: *Deleted

## 2014-04-02 ENCOUNTER — Ambulatory Visit (INDEPENDENT_AMBULATORY_CARE_PROVIDER_SITE_OTHER): Payer: Medicaid Other | Admitting: Family Medicine

## 2014-04-02 VITALS — BP 124/73 | HR 93 | Temp 98.3°F | Ht 64.0 in | Wt 157.5 lb

## 2014-04-02 DIAGNOSIS — F17200 Nicotine dependence, unspecified, uncomplicated: Secondary | ICD-10-CM

## 2014-04-02 DIAGNOSIS — F329 Major depressive disorder, single episode, unspecified: Secondary | ICD-10-CM

## 2014-04-02 DIAGNOSIS — Z720 Tobacco use: Secondary | ICD-10-CM

## 2014-04-02 DIAGNOSIS — Z23 Encounter for immunization: Secondary | ICD-10-CM

## 2014-04-02 DIAGNOSIS — F32A Depression, unspecified: Secondary | ICD-10-CM

## 2014-04-02 MED ORDER — VARENICLINE TARTRATE 0.5 MG X 11 & 1 MG X 42 PO MISC: ORAL | Status: DC

## 2014-04-02 MED ORDER — VENLAFAXINE HCL ER 75 MG PO TB24: 75.0000 mg | ORAL_TABLET | Freq: Every day | ORAL | Status: DC

## 2014-04-02 NOTE — Patient Instructions (Signed)
Lets plan on staying on the antidepressant for at least 6 months.  See me then and we will talk.  I am fine with long term antidepressants if that is what seems best for you. Flu shot today. Good luck with smoking cessation.  Call me in one month and ask for the continuation pack of Chantix.

## 2014-04-02 NOTE — Assessment & Plan Note (Signed)
Ready to quit.

## 2014-04-02 NOTE — Telephone Encounter (Signed)
Christy, Pharmacist with [facility] called regarding pt's Rx for venlafaxine 75 mg tablets.  The tablet form is on back order; can medication be changed to Venlafaxine 75 mg capsule.  Order given to change from 75 mg tablets to 75 mg capsule.  No change in dosage or directions.  Clovis PuMartin, Dornell Grasmick L, RN

## 2014-04-02 NOTE — Progress Notes (Signed)
   Subjective:    Patient ID: Kelsey [name], female    DOB: 10/19/1985, 29 y.o.   MRN: [id number]  HPI Tried to wean off antidepressants.  It did not work well.  She became more irritable.  Impulsively quit her job.  (Fortunately, they took her back.)  No SI or HI.  Wants to restart effexor.  Eating healthier and more active  Wants to quit smoking.  Try chantix again.  Was on a good start to success before her father died.   Review of Systems     Objective:   Physical Exam Note weight loss.   Affect normal        Assessment & Plan:

## 2014-04-02 NOTE — Assessment & Plan Note (Signed)
Restart effexor which controled her symptoms well.  She realizes that she also needs counseling.  Difficult  To fit in right now.

## 2014-04-05 NOTE — Telephone Encounter (Signed)
Agree 

## 2014-06-25 ENCOUNTER — Encounter: Payer: Self-pay | Admitting: Family Medicine

## 2014-06-25 ENCOUNTER — Ambulatory Visit (INDEPENDENT_AMBULATORY_CARE_PROVIDER_SITE_OTHER): Payer: Medicaid Other | Admitting: Family Medicine

## 2014-06-25 ENCOUNTER — Other Ambulatory Visit (HOSPITAL_COMMUNITY)
Admission: RE | Admit: 2014-06-25 | Discharge: 2014-06-25 | Disposition: A | Discharge status: Home / Self Care | Payer: Medicaid Other | Source: Ambulatory Visit | Attending: Family Medicine | Admitting: Family Medicine

## 2014-06-25 VITALS — BP 131/70 | HR 100 | Temp 98.4°F | Ht 64.0 in | Wt 148.7 lb

## 2014-06-25 DIAGNOSIS — N760 Acute vaginitis: Secondary | ICD-10-CM

## 2014-06-25 DIAGNOSIS — Z720 Tobacco use: Secondary | ICD-10-CM

## 2014-06-25 DIAGNOSIS — Z202 Contact with and (suspected) exposure to infections with a predominantly sexual mode of transmission: Secondary | ICD-10-CM | POA: Insufficient documentation

## 2014-06-25 DIAGNOSIS — F908 Attention-deficit hyperactivity disorder, other type: Secondary | ICD-10-CM | POA: Diagnosis not present

## 2014-06-25 DIAGNOSIS — F17200 Nicotine dependence, unspecified, uncomplicated: Secondary | ICD-10-CM

## 2014-06-25 DIAGNOSIS — Z113 Encounter for screening for infections with a predominantly sexual mode of transmission: Secondary | ICD-10-CM | POA: Insufficient documentation

## 2014-06-25 DIAGNOSIS — B9689 Other specified bacterial agents as the cause of diseases classified elsewhere: Secondary | ICD-10-CM | POA: Insufficient documentation

## 2014-06-25 DIAGNOSIS — Q210 Ventricular septal defect: Secondary | ICD-10-CM

## 2014-06-25 DIAGNOSIS — F32A Depression, unspecified: Secondary | ICD-10-CM

## 2014-06-25 DIAGNOSIS — Z975 Presence of (intrauterine) contraceptive device: Secondary | ICD-10-CM

## 2014-06-25 DIAGNOSIS — F329 Major depressive disorder, single episode, unspecified: Secondary | ICD-10-CM

## 2014-06-25 DIAGNOSIS — A499 Bacterial infection, unspecified: Secondary | ICD-10-CM

## 2014-06-25 LAB — POCT WET PREP (WET MOUNT): CLUE CELLS WET PREP WHIFF POC: POSITIVE

## 2014-06-25 LAB — HIV ANTIBODY (ROUTINE TESTING W REFLEX): HIV 1&2 Ab, 4th Generation: NONREACTIVE

## 2014-06-25 LAB — RPR

## 2014-06-25 MED ORDER — METRONIDAZOLE 500 MG PO TABS: 500.0000 mg | ORAL_TABLET | Freq: Three times a day (TID) | ORAL | Status: DC

## 2014-06-25 NOTE — Patient Instructions (Addendum)
Great to see you today.  [facility] for [facility] to [facility].  We will let you know the results of the wet prep today. Dr. Leveda AnnaHensel will call you with the other lab results on Monday.
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
## 2014-06-25 NOTE — Assessment & Plan Note (Signed)
Birth control not a concern with implanon in place.

## 2014-06-25 NOTE — Assessment & Plan Note (Signed)
Will pick another time for smoking cessation.

## 2014-06-25 NOTE — Progress Notes (Signed)
Subjective:     Patient ID: Kelsey [name], female   DOB: 08-02-1985, 29 y.o.   MRN: [id number] Written by: Kelsey [name], MS3 HPI See Dr. Cyndia SkeetersHensel's note.   Review of Systems See Dr. Cyndia SkeetersHensel's note.     Objective:   Physical Exam GU: external genitalia within normal limits. Vaginal mucosa pink, moist, with normal rugae.Blood present in vagina. Nonfriable cervix without lesions, no discharge noted on speculum exam.     Assessment:     Please see Problem List.     Plan:     Please see Problem List.

## 2014-06-25 NOTE — Assessment & Plan Note (Signed)
Nicely controled despite recent stress

## 2014-06-25 NOTE — Assessment & Plan Note (Signed)
Per wet prep.  Treat

## 2014-06-25 NOTE — Assessment & Plan Note (Signed)
Overdue for cards FU.

## 2014-06-25 NOTE — Assessment & Plan Note (Signed)
Await lab results.  Full spectrum testing.

## 2014-06-28 ENCOUNTER — Telehealth: Payer: Self-pay | Admitting: Family Medicine

## 2014-06-28 LAB — GC/CHLAMYDIA PROBE AMP (~~LOC~~) NOT AT ARMC
CHLAMYDIA, DNA PROBE: NEGATIVE
Neisseria Gonorrhea: NEGATIVE

## 2014-06-28 NOTE — Telephone Encounter (Signed)
Will forward to MD to confirm results from patient's last office visit. Jazmin Hartsell,CMA

## 2014-06-28 NOTE — Telephone Encounter (Signed)
Given results.  GC and chlamydia are still pending.

## 2014-06-28 NOTE — Telephone Encounter (Signed)
Wants results

## 2014-07-21 ENCOUNTER — Ambulatory Visit: Payer: Medicaid Other | Admitting: Cardiology

## 2014-07-30 ENCOUNTER — Encounter: Payer: Self-pay | Admitting: Cardiology

## 2014-09-06 ENCOUNTER — Other Ambulatory Visit: Payer: Self-pay | Admitting: Family Medicine

## 2014-09-15 ENCOUNTER — Other Ambulatory Visit: Payer: Self-pay | Admitting: Family Medicine

## 2014-09-15 DIAGNOSIS — N760 Acute vaginitis: Principal | ICD-10-CM

## 2014-09-15 DIAGNOSIS — B9689 Other specified bacterial agents as the cause of diseases classified elsewhere: Secondary | ICD-10-CM

## 2014-09-15 MED ORDER — METRONIDAZOLE 500 MG PO TABS: 500.0000 mg | ORAL_TABLET | Freq: Three times a day (TID) | ORAL | Status: DC

## 2014-09-15 NOTE — Telephone Encounter (Signed)
Refill request for Metronidazole.    This was copied & pasted from patient's MyChart msg: Can you please send in a refill for the Metronidazole. I missed the last two pills and thought that I would be fine however, I am having the same symptoms again as before. Thanks!

## 2015-06-25 ENCOUNTER — Emergency Department (HOSPITAL_COMMUNITY): Payer: Self-pay

## 2015-06-25 ENCOUNTER — Emergency Department (HOSPITAL_COMMUNITY)
Admission: EM | Admit: 2015-06-25 | Discharge: 2015-06-25 | Disposition: A | Discharge status: Home / Self Care | Payer: Self-pay | Attending: Emergency Medicine | Admitting: Emergency Medicine

## 2015-06-25 ENCOUNTER — Encounter (HOSPITAL_COMMUNITY): Payer: Self-pay | Admitting: Emergency Medicine

## 2015-06-25 DIAGNOSIS — Z79899 Other long term (current) drug therapy: Secondary | ICD-10-CM | POA: Insufficient documentation

## 2015-06-25 DIAGNOSIS — Z792 Long term (current) use of antibiotics: Secondary | ICD-10-CM | POA: Insufficient documentation

## 2015-06-25 DIAGNOSIS — S20212A Contusion of left front wall of thorax, initial encounter: Secondary | ICD-10-CM | POA: Insufficient documentation

## 2015-06-25 DIAGNOSIS — Z791 Long term (current) use of non-steroidal anti-inflammatories (NSAID): Secondary | ICD-10-CM | POA: Insufficient documentation

## 2015-06-25 DIAGNOSIS — R52 Pain, unspecified: Secondary | ICD-10-CM

## 2015-06-25 DIAGNOSIS — Y999 Unspecified external cause status: Secondary | ICD-10-CM | POA: Insufficient documentation

## 2015-06-25 DIAGNOSIS — S8012XA Contusion of left lower leg, initial encounter: Secondary | ICD-10-CM | POA: Insufficient documentation

## 2015-06-25 DIAGNOSIS — F17210 Nicotine dependence, cigarettes, uncomplicated: Secondary | ICD-10-CM | POA: Insufficient documentation

## 2015-06-25 DIAGNOSIS — J45909 Unspecified asthma, uncomplicated: Secondary | ICD-10-CM | POA: Insufficient documentation

## 2015-06-25 DIAGNOSIS — Y92481 Parking lot as the place of occurrence of the external cause: Secondary | ICD-10-CM | POA: Insufficient documentation

## 2015-06-25 DIAGNOSIS — S7012XA Contusion of left thigh, initial encounter: Secondary | ICD-10-CM | POA: Insufficient documentation

## 2015-06-25 DIAGNOSIS — Y939 Activity, unspecified: Secondary | ICD-10-CM | POA: Insufficient documentation

## 2015-06-25 DIAGNOSIS — F329 Major depressive disorder, single episode, unspecified: Secondary | ICD-10-CM | POA: Insufficient documentation

## 2015-06-25 LAB — POC URINE PREG, ED: Preg Test, Ur: NEGATIVE

## 2015-06-25 MED ORDER — ONDANSETRON 8 MG PO TBDP
8.0000 mg | ORAL_TABLET | Freq: Once | ORAL | Status: AC
  Administered 2015-06-25: 8 mg via ORAL
  Filled 2015-06-25: qty 1

## 2015-06-25 MED ORDER — HYDROCODONE-ACETAMINOPHEN 5-325 MG PO TABS: 1.0000 | ORAL_TABLET | ORAL | Status: DC | PRN

## 2015-06-25 NOTE — ED Notes (Signed)
Pt unable to give urine at this time.  Pt understands the need for urine as soon as possible.  Pt will let us know.
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
## 2015-06-28 ENCOUNTER — Ambulatory Visit (INDEPENDENT_AMBULATORY_CARE_PROVIDER_SITE_OTHER): Payer: Self-pay | Admitting: Internal Medicine

## 2015-06-29 ENCOUNTER — Encounter: Payer: Self-pay | Admitting: Family Medicine

## 2015-06-29 ENCOUNTER — Telehealth: Payer: Self-pay | Admitting: Family Medicine

## 2015-06-29 ENCOUNTER — Ambulatory Visit (INDEPENDENT_AMBULATORY_CARE_PROVIDER_SITE_OTHER): Payer: Self-pay | Admitting: Family Medicine

## 2015-06-29 ENCOUNTER — Ambulatory Visit (HOSPITAL_COMMUNITY)
Admission: RE | Admit: 2015-06-29 | Discharge: 2015-06-29 | Disposition: A | Discharge status: Home / Self Care | Payer: No Typology Code available for payment source | Source: Ambulatory Visit | Attending: Family Medicine | Admitting: Family Medicine

## 2015-06-29 VITALS — BP 154/71 | HR 109 | Temp 98.3°F | Ht 64.0 in | Wt 151.5 lb

## 2015-06-29 DIAGNOSIS — S79822A Other specified injuries of left thigh, initial encounter: Secondary | ICD-10-CM | POA: Insufficient documentation

## 2015-06-29 DIAGNOSIS — S79822D Other specified injuries of left thigh, subsequent encounter: Secondary | ICD-10-CM

## 2015-06-29 DIAGNOSIS — F329 Major depressive disorder, single episode, unspecified: Secondary | ICD-10-CM

## 2015-06-29 DIAGNOSIS — S32415A Nondisplaced fracture of anterior wall of left acetabulum, initial encounter for closed fracture: Secondary | ICD-10-CM | POA: Insufficient documentation

## 2015-06-29 DIAGNOSIS — F32A Depression, unspecified: Secondary | ICD-10-CM

## 2015-06-29 LAB — POCT HEMOGLOBIN: Hemoglobin: 12.5 g/dL (ref 12.2–16.2)

## 2015-06-29 MED ORDER — IOPAMIDOL (ISOVUE-300) INJECTION 61%
100.0000 mL | Freq: Once | INTRAVENOUS | Status: AC | PRN
  Administered 2015-06-29: 100 mL via INTRAVENOUS
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
## 2015-06-29 NOTE — Patient Instructions (Signed)
Getting CT scan of your thigh.  If any dizziness/lightheadedness or worsening go to ER immediately  I will call you as soon as I have your results back  Try tylenol for pain. No advil until we're sure you aren't bleeding into your thigh.  Be well, Dr. Pollie MeyerMcIntyre
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
## 2015-06-30 NOTE — Telephone Encounter (Signed)
Patient informed of appointment with Dr. Carola FrostHandy on 5/22 at 930am, expressed understanding. Patient asks if MD could refill her venlafaxine and states that she is nearly out of pain meds and wonders if MD could prescribe a few more to make it to her appointment.

## 2015-06-30 NOTE — Telephone Encounter (Signed)
Attempted to reach patient, no answer. Did not leave VM. In short - need to speak with her about venlafaxine since it's been a long time since we prescribed it I am happy to give her more pain medication.  I will call her again tomorrow  Latrelle DodrillBrittany J Jaime Dome, MD

## 2015-07-01 MED ORDER — HYDROCODONE-ACETAMINOPHEN 5-325 MG PO TABS: 1.0000 | ORAL_TABLET | ORAL | Status: DC | PRN

## 2015-07-01 MED ORDER — VENLAFAXINE HCL ER 75 MG PO TB24: 75.0000 mg | ORAL_TABLET | Freq: Every day | ORAL | Status: DC
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
## 2015-07-01 NOTE — Telephone Encounter (Signed)
Pt is calling back and would like a refill on her pain medication. She would also like to speak to the doctor about the Venlafaxine. jw

## 2015-07-15 ENCOUNTER — Telehealth: Payer: Self-pay | Admitting: *Deleted

## 2015-07-15 MED ORDER — VENLAFAXINE HCL ER 75 MG PO CP24: 75.0000 mg | ORAL_CAPSULE | Freq: Every day | ORAL | Status: DC

## 2015-07-15 NOTE — Telephone Encounter (Signed)
Received a fax from [facility] requesting to venlafaxine tabs to capsules.  Verbal order given by Dr. Leveda AnnaHensel to change to capsules.  Clovis PuMartin, Zylah Elsbernd L, RN

## 2015-07-15 NOTE — Telephone Encounter (Signed)
Agree 

## 2015-08-02 ENCOUNTER — Ambulatory Visit (INDEPENDENT_AMBULATORY_CARE_PROVIDER_SITE_OTHER): Payer: Self-pay | Admitting: Internal Medicine

## 2015-08-02 ENCOUNTER — Encounter: Payer: Self-pay | Admitting: Internal Medicine

## 2015-08-02 ENCOUNTER — Other Ambulatory Visit (HOSPITAL_COMMUNITY)
Admission: RE | Admit: 2015-08-02 | Discharge: 2015-08-02 | Disposition: A | Discharge status: Home / Self Care | Payer: No Typology Code available for payment source | Source: Ambulatory Visit | Attending: Family Medicine | Admitting: Family Medicine

## 2015-08-02 VITALS — BP 126/70 | HR 87 | Temp 98.7°F | Ht 64.0 in | Wt 156.8 lb

## 2015-08-02 DIAGNOSIS — Z113 Encounter for screening for infections with a predominantly sexual mode of transmission: Secondary | ICD-10-CM | POA: Insufficient documentation

## 2015-08-02 DIAGNOSIS — B9689 Other specified bacterial agents as the cause of diseases classified elsewhere: Secondary | ICD-10-CM

## 2015-08-02 DIAGNOSIS — N760 Acute vaginitis: Secondary | ICD-10-CM

## 2015-08-02 DIAGNOSIS — N898 Other specified noninflammatory disorders of vagina: Secondary | ICD-10-CM

## 2015-08-02 DIAGNOSIS — A499 Bacterial infection, unspecified: Secondary | ICD-10-CM

## 2015-08-02 LAB — POCT WET PREP (WET MOUNT): CLUE CELLS WET PREP WHIFF POC: POSITIVE

## 2015-08-02 MED ORDER — METRONIDAZOLE 500 MG PO TABS: 500.0000 mg | ORAL_TABLET | Freq: Two times a day (BID) | ORAL | Status: DC

## 2015-08-02 NOTE — Progress Notes (Signed)
   Subjective:    Patient ID: Kelsey [name], female    DOB: 1986/01/25, 30 y.o.   MRN: [id number]  HPI  Patient presents with vaginal discharge.   Vaginal discharge Patient reports intermittent vaginal discharge for the past year. She was diagnosed with BV about a year ago, and began treatment, but did not complete the course. She called the clinic and was prescribed another round of metronidazole to complete treatment, and did take the full five day course. Since then she has had intermittent clear to white milky discharge on most days. The discharge is not so thick that she requires a pantyliner. She has also noticed a foul odor to the discharge. Denies vaginal irritation or itching. Denies dysuria or hematuria. In the past year, she has had intercourse only with her ex-boyfriend, most recently four months ago. She was with this same partner when she was diagnosed with BV last year and has had no other partners since. She reports using a condom four months ago when they last had intercourse.   Patient is non-smoker.   Review of Systems See HPI.     Objective:   Physical Exam  Constitutional: She is oriented to person, place, and time. She appears well-developed and well-nourished. No distress.  Genitourinary: Vaginal discharge (Minimal white mucinous discharge) found.  No cervical motion tenderness, no foul-smelling odor  Neurological: She is alert and oriented to person, place, and time.  Skin: She is not diaphoretic.  Psychiatric: She has a normal mood and affect. Her behavior is normal.      Assessment & Plan:  Bacterial vaginosis Many bacteria, many clue cells, and positive Whiff on wet prep. No yeast or trich noted. GC/chlamydia sent.  - Treat with metronidazole x5d - Will call patient with results of GC/chlamydia. Okay to leave message per patient.    Tarri AbernethyAbigail J Lancaster, MD PGY-1 [facility] Pager [phone]

## 2015-08-02 NOTE — Assessment & Plan Note (Signed)
Many bacteria, many clue cells, and positive Whiff on wet prep. No yeast or trich noted. GC/chlamydia sent.  - Treat with metronidazole x5d - Will call patient with results of GC/chlamydia. Okay to leave message per patient.

## 2015-08-02 NOTE — Patient Instructions (Signed)
It was nice meeting you today Kelsey [name]!  To treat your bacterial vaginosis, please begin taking metronidazole twice a day for the next 7 days. If you continue to have foul-smelling discharge, or you begin to have more discharge, please call the office to schedule another appointment.   I will call in a couple days to let you know the results of your other lab tests.   If you have any questions or concerns, please feel free to call the office.   Be well,  Dr. Natale MilchLancaster

## 2015-08-03 LAB — CERVICOVAGINAL ANCILLARY ONLY
Chlamydia: NEGATIVE
Neisseria Gonorrhea: NEGATIVE

## 2015-08-04 ENCOUNTER — Encounter (HOSPITAL_COMMUNITY): Payer: Self-pay | Admitting: *Deleted

## 2015-08-04 ENCOUNTER — Telehealth: Payer: Self-pay | Admitting: Internal Medicine

## 2015-08-04 ENCOUNTER — Telehealth: Payer: Self-pay | Admitting: Family Medicine

## 2015-08-04 DIAGNOSIS — Z298 Encounter for other specified prophylactic measures: Secondary | ICD-10-CM

## 2015-08-04 DIAGNOSIS — Z2989 Encounter for other specified prophylactic measures: Secondary | ICD-10-CM

## 2015-08-04 NOTE — Telephone Encounter (Signed)
Pt called because she is having surgery tomorrow at 10 AM and was told to call her PCP to have a antibiotic called in to her pharmacy. jw

## 2015-08-04 NOTE — Progress Notes (Signed)
Pt has a VSD which per pt is asymptomatic and small. She states she spoke with Dr. Leveda AnnaHensel today and he stated that she would just need to have antibiotics IV prior to surgery, it was too late to give her oral ones.
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
## 2015-08-04 NOTE — Anesthesia Preprocedure Evaluation (Addendum)
Anesthesia Evaluation  Patient identified by MRN, date of birth, ID band Patient awake    Reviewed: Allergy & Precautions, H&P , NPO status , Patient's Chart, lab work & pertinent test results  Airway Mallampati: II  TM Distance: >3 FB Neck ROM: Full    Dental no notable dental hx. (+) Teeth Intact, Dental Advisory Given   Pulmonary asthma , Current Smoker,    Pulmonary exam normal breath sounds clear to auscultation       Cardiovascular negative cardio ROS  + Valvular Problems/Murmurs  Rhythm:Regular Rate:Normal     Neuro/Psych  Headaches, Anxiety Depression negative psych ROS   GI/Hepatic negative GI ROS, Neg liver ROS,   Endo/Other  negative endocrine ROS  Renal/GU negative Renal ROS  negative genitourinary   Musculoskeletal   Abdominal   Peds  Hematology negative hematology ROS (+)   Anesthesia Other Findings   Reproductive/Obstetrics negative OB ROS                           Anesthesia Physical Anesthesia Plan  ASA: II  Anesthesia Plan: General   Post-op Pain Management:    Induction: Intravenous  Airway Management Planned: LMA and Oral ETT  Additional Equipment:   Intra-op Plan:   Post-operative Plan: Extubation in OR  Informed Consent: I have reviewed the patients History and Physical, chart, labs and discussed the procedure including the risks, benefits and alternatives for the proposed anesthesia with the patient or authorized representative who has indicated his/her understanding and acceptance.   Dental advisory given  Plan Discussed with: CRNA  Anesthesia Plan Comments:         Anesthesia Quick Evaluation

## 2015-08-04 NOTE — Telephone Encounter (Signed)
Called patient to inform her of lab results. Patient voiced understanding.   Kelsey AbernethyAbigail J Saria Haran, MD PGY-1 [facility] Pager [phone]

## 2015-08-05 ENCOUNTER — Encounter (HOSPITAL_COMMUNITY)
Admission: RE | Disposition: A | Discharge status: Home / Self Care | Payer: Self-pay | Source: Ambulatory Visit | Attending: Orthopedic Surgery

## 2015-08-05 ENCOUNTER — Encounter (HOSPITAL_COMMUNITY): Payer: Self-pay | Admitting: Surgery

## 2015-08-05 ENCOUNTER — Ambulatory Visit (HOSPITAL_COMMUNITY): Payer: No Typology Code available for payment source | Admitting: Anesthesiology

## 2015-08-05 ENCOUNTER — Ambulatory Visit (HOSPITAL_COMMUNITY)
Admission: RE | Admit: 2015-08-05 | Discharge: 2015-08-05 | Disposition: A | Discharge status: Home / Self Care | Payer: No Typology Code available for payment source | Source: Ambulatory Visit | Attending: Orthopedic Surgery | Admitting: Orthopedic Surgery

## 2015-08-05 DIAGNOSIS — F329 Major depressive disorder, single episode, unspecified: Secondary | ICD-10-CM | POA: Insufficient documentation

## 2015-08-05 DIAGNOSIS — F17210 Nicotine dependence, cigarettes, uncomplicated: Secondary | ICD-10-CM | POA: Insufficient documentation

## 2015-08-05 DIAGNOSIS — Z79899 Other long term (current) drug therapy: Secondary | ICD-10-CM | POA: Insufficient documentation

## 2015-08-05 DIAGNOSIS — M25572 Pain in left ankle and joints of left foot: Secondary | ICD-10-CM | POA: Diagnosis not present

## 2015-08-05 DIAGNOSIS — Z793 Long term (current) use of hormonal contraceptives: Secondary | ICD-10-CM | POA: Diagnosis not present

## 2015-08-05 DIAGNOSIS — S7012XA Contusion of left thigh, initial encounter: Secondary | ICD-10-CM | POA: Insufficient documentation

## 2015-08-05 DIAGNOSIS — T792XXA Traumatic secondary and recurrent hemorrhage and seroma, initial encounter: Secondary | ICD-10-CM

## 2015-08-05 HISTORY — PX: I & D EXTREMITY: SHX5045

## 2015-08-05 HISTORY — DX: Anemia, unspecified: D64.9

## 2015-08-05 LAB — CBC
HEMATOCRIT: 41.1 % (ref 36.0–46.0)
HEMOGLOBIN: 13.6 g/dL (ref 12.0–15.0)
MCH: 32.5 pg (ref 26.0–34.0)
MCHC: 33.1 g/dL (ref 30.0–36.0)
MCV: 98.3 fL (ref 78.0–100.0)
Platelets: 271 10*3/uL (ref 150–400)
RBC: 4.18 MIL/uL (ref 3.87–5.11)
RDW: 13.2 % (ref 11.5–15.5)
WBC: 6.2 10*3/uL (ref 4.0–10.5)

## 2015-08-05 LAB — HCG, SERUM, QUALITATIVE: Preg, Serum: NEGATIVE

## 2015-08-05 SURGERY — IRRIGATION AND DEBRIDEMENT EXTREMITY
Anesthesia: General | Site: Thigh | Laterality: Left

## 2015-08-05 MED ORDER — KETOROLAC TROMETHAMINE 10 MG PO TABS: 10.0000 mg | ORAL_TABLET | Freq: Four times a day (QID) | ORAL | Status: DC | PRN

## 2015-08-05 MED ORDER — ONDANSETRON HCL 4 MG PO TABS: 4.0000 mg | ORAL_TABLET | Freq: Three times a day (TID) | ORAL | Status: DC | PRN

## 2015-08-05 MED ORDER — ROCURONIUM BROMIDE 50 MG/5ML IV SOLN
INTRAVENOUS | Status: AC
  Filled 2015-08-05: qty 1

## 2015-08-05 MED ORDER — MIDAZOLAM HCL 5 MG/5ML IJ SOLN
INTRAMUSCULAR | Status: DC | PRN
  Administered 2015-08-05: 2 mg via INTRAVENOUS

## 2015-08-05 MED ORDER — LIDOCAINE HCL (CARDIAC) 20 MG/ML IV SOLN
INTRAVENOUS | Status: DC | PRN
  Administered 2015-08-05: 50 mg via INTRAVENOUS

## 2015-08-05 MED ORDER — PHENYLEPHRINE 40 MCG/ML (10ML) SYRINGE FOR IV PUSH (FOR BLOOD PRESSURE SUPPORT)
PREFILLED_SYRINGE | INTRAVENOUS | Status: AC
  Filled 2015-08-05: qty 10

## 2015-08-05 MED ORDER — PROPOFOL 10 MG/ML IV BOLUS
INTRAVENOUS | Status: AC
  Filled 2015-08-05: qty 20

## 2015-08-05 MED ORDER — HYDROCODONE-ACETAMINOPHEN 5-325 MG PO TABS: 1.0000 | ORAL_TABLET | Freq: Three times a day (TID) | ORAL | Status: DC | PRN

## 2015-08-05 MED ORDER — FENTANYL CITRATE (PF) 250 MCG/5ML IJ SOLN
INTRAMUSCULAR | Status: AC
  Filled 2015-08-05: qty 5

## 2015-08-05 MED ORDER — DEXAMETHASONE SODIUM PHOSPHATE 10 MG/ML IJ SOLN
INTRAMUSCULAR | Status: DC | PRN
  Administered 2015-08-05: 8 mg via INTRAVENOUS

## 2015-08-05 MED ORDER — MIDAZOLAM HCL 2 MG/2ML IJ SOLN
INTRAMUSCULAR | Status: AC
  Filled 2015-08-05: qty 2

## 2015-08-05 MED ORDER — PHENYLEPHRINE HCL 10 MG/ML IJ SOLN
INTRAMUSCULAR | Status: DC | PRN
  Administered 2015-08-05 (×3): 40 ug via INTRAVENOUS

## 2015-08-05 MED ORDER — PROMETHAZINE HCL 25 MG/ML IJ SOLN
INTRAMUSCULAR | Status: AC
  Filled 2015-08-05: qty 1

## 2015-08-05 MED ORDER — CEFAZOLIN SODIUM 1 G IJ SOLR
INTRAMUSCULAR | Status: AC
  Filled 2015-08-05: qty 20

## 2015-08-05 MED ORDER — ONDANSETRON HCL 4 MG/2ML IJ SOLN
INTRAMUSCULAR | Status: DC | PRN
  Administered 2015-08-05: 4 mg via INTRAVENOUS

## 2015-08-05 MED ORDER — CEFAZOLIN SODIUM-DEXTROSE 2-3 GM-% IV SOLR
INTRAVENOUS | Status: DC | PRN
  Administered 2015-08-05: 2 g via INTRAVENOUS

## 2015-08-05 MED ORDER — HYDROMORPHONE HCL 1 MG/ML IJ SOLN
0.2500 mg | INTRAMUSCULAR | Status: DC | PRN
  Administered 2015-08-05: 0.5 mg via INTRAVENOUS

## 2015-08-05 MED ORDER — 0.9 % SODIUM CHLORIDE (POUR BTL) OPTIME
TOPICAL | Status: DC | PRN
  Administered 2015-08-05: 1000 mL

## 2015-08-05 MED ORDER — LACTATED RINGERS IV SOLN
INTRAVENOUS | Status: DC
  Administered 2015-08-05: 07:00:00 via INTRAVENOUS

## 2015-08-05 MED ORDER — PROMETHAZINE HCL 25 MG/ML IJ SOLN
6.2500 mg | Freq: Once | INTRAMUSCULAR | Status: AC
  Administered 2015-08-05: 6.25 mg via INTRAVENOUS

## 2015-08-05 MED ORDER — PROPOFOL 10 MG/ML IV BOLUS
INTRAVENOUS | Status: DC | PRN
  Administered 2015-08-05: 200 mg via INTRAVENOUS

## 2015-08-05 MED ORDER — HYDROMORPHONE HCL 1 MG/ML IJ SOLN
INTRAMUSCULAR | Status: AC
  Filled 2015-08-05: qty 1

## 2015-08-05 MED ORDER — FENTANYL CITRATE (PF) 100 MCG/2ML IJ SOLN
INTRAMUSCULAR | Status: DC | PRN
  Administered 2015-08-05 (×3): 50 ug via INTRAVENOUS

## 2015-08-05 SURGICAL SUPPLY — 65 items
BANDAGE ELASTIC 4 VELCRO ST LF (GAUZE/BANDAGES/DRESSINGS) ×2 IMPLANT
BLADE SURG 10 STRL SS (BLADE) ×3 IMPLANT
BNDG CMPR MED 10X6 ELC LF (GAUZE/BANDAGES/DRESSINGS) ×1
BNDG COHESIVE 4X5 TAN STRL (GAUZE/BANDAGES/DRESSINGS) ×3 IMPLANT
BNDG ELASTIC 6X10 VLCR STRL LF (GAUZE/BANDAGES/DRESSINGS) ×2 IMPLANT
BNDG GAUZE ELAST 4 BULKY (GAUZE/BANDAGES/DRESSINGS) ×6 IMPLANT
BNDG GAUZE STRTCH 6 (GAUZE/BANDAGES/DRESSINGS) ×9 IMPLANT
BRUSH SCRUB DISP (MISCELLANEOUS) ×6 IMPLANT
CANISTER SUCTION 2500CC (MISCELLANEOUS) ×1 IMPLANT
CANISTER WOUND CARE 500ML ATS (WOUND CARE) ×3 IMPLANT
COVER SURGICAL LIGHT HANDLE (MISCELLANEOUS) ×6 IMPLANT
DRAIN CHANNEL 15F RND FF W/TCR (WOUND CARE) ×2 IMPLANT
DRAPE INCISE IOBAN 66X45 STRL (DRAPES) IMPLANT
DRAPE ORTHO SPLIT 77X108 STRL (DRAPES)
DRAPE PROXIMA HALF (DRAPES) IMPLANT
DRAPE SURG ORHT 6 SPLT 77X108 (DRAPES) IMPLANT
DRAPE U-SHAPE 47X51 STRL (DRAPES) ×3 IMPLANT
DRSG ADAPTIC 3X8 NADH LF (GAUZE/BANDAGES/DRESSINGS) ×3 IMPLANT
DRSG EMULSION OIL 3X3 NADH (GAUZE/BANDAGES/DRESSINGS) ×2 IMPLANT
DRSG PAD ABDOMINAL 8X10 ST (GAUZE/BANDAGES/DRESSINGS) IMPLANT
DRSG VAC ATS LRG SENSATRAC (GAUZE/BANDAGES/DRESSINGS) IMPLANT
DRSG VAC ATS MED SENSATRAC (GAUZE/BANDAGES/DRESSINGS) IMPLANT
DRSG VAC ATS SM SENSATRAC (GAUZE/BANDAGES/DRESSINGS) IMPLANT
ELECT CAUTERY BLADE 6.4 (BLADE) IMPLANT
ELECT REM PT RETURN 9FT ADLT (ELECTROSURGICAL) ×3
ELECTRODE REM PT RTRN 9FT ADLT (ELECTROSURGICAL) ×1 IMPLANT
EVACUATOR SILICONE 100CC (DRAIN) ×2 IMPLANT
GAUZE SPONGE 4X4 12PLY STRL (GAUZE/BANDAGES/DRESSINGS) ×3 IMPLANT
GAUZE XEROFORM 5X9 LF (GAUZE/BANDAGES/DRESSINGS) ×1 IMPLANT
GLOVE BIO SURGEON STRL SZ7.5 (GLOVE) ×3 IMPLANT
GLOVE BIO SURGEON STRL SZ8 (GLOVE) ×3 IMPLANT
GLOVE BIOGEL PI IND STRL 7.5 (GLOVE) ×1 IMPLANT
GLOVE BIOGEL PI IND STRL 8 (GLOVE) ×2 IMPLANT
GLOVE BIOGEL PI INDICATOR 7.5 (GLOVE) ×2
GLOVE BIOGEL PI INDICATOR 8 (GLOVE) ×4
GOWN STRL REUS W/ TWL LRG LVL3 (GOWN DISPOSABLE) ×2 IMPLANT
GOWN STRL REUS W/ TWL XL LVL3 (GOWN DISPOSABLE) ×1 IMPLANT
GOWN STRL REUS W/TWL LRG LVL3 (GOWN DISPOSABLE) ×6
GOWN STRL REUS W/TWL XL LVL3 (GOWN DISPOSABLE) ×3
HANDPIECE INTERPULSE COAX TIP (DISPOSABLE)
KIT BASIN OR (CUSTOM PROCEDURE TRAY) ×3 IMPLANT
KIT ROOM TURNOVER OR (KITS) ×3 IMPLANT
MANIFOLD NEPTUNE II (INSTRUMENTS) ×3 IMPLANT
MANIFOLD NEPTUNE WASTE (CANNULA) ×2 IMPLANT
NS IRRIG 1000ML POUR BTL (IV SOLUTION) ×3 IMPLANT
PACK ORTHO EXTREMITY (CUSTOM PROCEDURE TRAY) ×3 IMPLANT
PAD ARMBOARD 7.5X6 YLW CONV (MISCELLANEOUS) ×6 IMPLANT
PAD CAST 4YDX4 CTTN HI CHSV (CAST SUPPLIES) IMPLANT
PADDING CAST COTTON 4X4 STRL (CAST SUPPLIES) ×3
PADDING CAST COTTON 6X4 STRL (CAST SUPPLIES) ×1 IMPLANT
SET HNDPC FAN SPRY TIP SCT (DISPOSABLE) IMPLANT
SPONGE LAP 18X18 X RAY DECT (DISPOSABLE) ×3 IMPLANT
STAPLER VISISTAT 35W (STAPLE) ×2 IMPLANT
STOCKINETTE IMPERVIOUS 9X36 MD (GAUZE/BANDAGES/DRESSINGS) ×3 IMPLANT
SUT ETHILON 2 0 FS 18 (SUTURE) ×2 IMPLANT
SUT ETHILON 3 0 PS 1 (SUTURE) ×2 IMPLANT
SUT PDS AB 2-0 CT1 27 (SUTURE) IMPLANT
TOWEL OR 17X24 6PK STRL BLUE (TOWEL DISPOSABLE) ×3 IMPLANT
TOWEL OR 17X26 10 PK STRL BLUE (TOWEL DISPOSABLE) ×6 IMPLANT
TUBE ANAEROBIC SPECIMEN COL (MISCELLANEOUS) IMPLANT
TUBE CONNECTING 12'X1/4 (SUCTIONS) ×1
TUBE CONNECTING 12X1/4 (SUCTIONS) ×2 IMPLANT
UNDERPAD 30X30 INCONTINENT (UNDERPADS AND DIAPERS) ×3 IMPLANT
WATER STERILE IRR 1000ML POUR (IV SOLUTION) ×1 IMPLANT
YANKAUER SUCT BULB TIP NO VENT (SUCTIONS) ×3 IMPLANT

## 2015-08-05 NOTE — Transfer of Care (Signed)
Immediate Anesthesia Transfer of Care Note  Patient: Kelsey [name]  Procedure(s) Performed: Procedure(s): IRRIGATION AND DEBRIDEMENT LEFT THIGH  (Left)  Patient Location: PACU  Anesthesia Type:General  Level of Consciousness: awake, alert  and oriented  Airway & Oxygen Therapy: Patient connected to face mask oxygen  Post-op Assessment: Post -op Vital signs reviewed and stable  Post vital signs: stable  Last Vitals:  Filed Vitals:   08/05/15 0716  BP: 99/45  Pulse: 80  Temp: 36.9 C  Resp: 18    Last Pain: There were no vitals filed for this visit.    Patients Stated Pain Goal: 8 (08/05/15 0709)  Complications: No apparent anesthesia complications

## 2015-08-05 NOTE — Progress Notes (Signed)
Orthopedic Tech Progress Note Patient Details:  Kelsey [name] 05/10/1985 [id number]  Ortho Devices Type of Ortho Device: Postop shoe/boot Ortho Device/Splint Interventions: Application   Saul FordyceJennifer C Deantae Shackleton 08/05/2015, 12:00 PM

## 2015-08-05 NOTE — Discharge Instructions (Signed)
Orthopaedic Trauma Service Discharge Instructions   General Discharge Instructions  WEIGHT BEARING STATUS: Weightbearing as tolerated  RANGE OF MOTION/ACTIVITY: range of motion as tolerated   Wound Care: we will change dressing at first follow up visit on 08/10/2015. Call for appointment  Empty drain and recharge as needed   PAIN MEDICATION USE AND EXPECTATIONS  You have likely been given narcotic medications to help control your pain.  After a traumatic event that results in an fracture (broken bone) with or without surgery, it is ok to use narcotic pain medications to help control one's pain.  We understand that everyone responds to pain differently and each individual patient will be evaluated on a regular basis for the continued need for narcotic medications. Ideally, narcotic medication use should last no more than 6-8 weeks (coinciding with fracture healing).   As a patient it is your responsibility as well to monitor narcotic medication use and report the amount and frequency you use these medications when you come to your office visit.   We would also advise that if you are using narcotic medications, you should take a dose prior to therapy to maximize you participation.  IF YOU ARE ON NARCOTIC MEDICATIONS IT IS NOT PERMISSIBLE TO OPERATE A MOTOR VEHICLE (MOTORCYCLE/CAR/TRUCK/MOPED) OR HEAVY MACHINERY DO NOT MIX NARCOTICS WITH OTHER CNS (CENTRAL NERVOUS SYSTEM) DEPRESSANTS SUCH AS ALCOHOL  Diet: as you were eating previously.  Can use over the counter stool softeners and bowel preparations, such as Miralax, to help with bowel movements.  Narcotics can be constipating.  Be sure to drink plenty of fluids    STOP SMOKING OR USING NICOTINE PRODUCTS!!!!  As discussed nicotine severely impairs your body's ability to heal surgical and traumatic wounds but also impairs bone healing.  Wounds and bone heal by forming microscopic blood vessels (angiogenesis) and nicotine is a vasoconstrictor  (essentially, shrinks blood vessels).  Therefore, if vasoconstriction occurs to these microscopic blood vessels they essentially disappear and are unable to deliver necessary nutrients to the healing tissue.  This is one modifiable factor that you can do to dramatically increase your chances of healing your injury.    (This means no smoking, no nicotine gum, patches, etc)  DO NOT USE NONSTEROIDAL ANTI-INFLAMMATORY DRUGS (NSAID'S)  Using products such as Advil (ibuprofen), Aleve (naproxen), Motrin (ibuprofen) for additional pain control during fracture healing can delay and/or prevent the healing response.  If you would like to take over the counter (OTC) medication, Tylenol (acetaminophen) is ok.  However, some narcotic medications that are given for pain control contain acetaminophen as well. Therefore, you should not exceed more than 4000 mg of tylenol in a day if you do not have liver disease.  Also note that there are may OTC medicines, such as cold medicines and allergy medicines that my contain tylenol as well.  If you have any questions about medications and/or interactions please ask your doctor/PA or your pharmacist.      ICE AND ELEVATE INJURED/OPERATIVE EXTREMITY  Using ice and elevating the injured extremity above your heart can help with swelling and pain control.  Icing in a pulsatile fashion, such as 20 minutes on and 20 minutes off, can be followed.    Do not place ice directly on skin. Make sure there is a barrier between to skin and the ice pack.    Using frozen items such as frozen peas works well as the conform nicely to the are that needs to be iced.  USE AN ACE WRAP  OR TED HOSE FOR SWELLING CONTROL  In addition to icing and elevation, Ace wraps or TED hose are used to help limit and resolve swelling.  It is recommended to use Ace wraps or TED hose until you are informed to stop.    When using Ace Wraps start the wrapping distally (farthest away from the body) and wrap proximally  (closer to the body)   Example: If you had surgery on your leg or thing and you do not have a splint on, start the ace wrap at the toes and work your way up to the thigh        If you had surgery on your upper extremity and do not have a splint on, start the ace wrap at your fingers and work your way up to the upper arm  IF YOU ARE IN A SPLINT OR CAST DO NOT REMOVE IT FOR ANY REASON   If your splint gets wet for any reason please contact the office immediately. You may shower in your splint or cast as long as you keep it dry.  This can be done by wrapping in a cast cover or garbage back (or similar)  Do Not stick any thing down your splint or cast such as pencils, money, or hangers to try and scratch yourself with.  If you feel itchy take benadryl as prescribed on the bottle for itching  IF YOU ARE IN A CAM BOOT (BLACK BOOT)  You may remove boot periodically. Perform daily dressing changes as noted below.  Wash the liner of the boot regularly and wear a sock when wearing the boot. It is recommended that you sleep in the boot until told otherwise  CALL THE OFFICE WITH ANY QUESTIONS OR CONCERNS: [phone]
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
## 2015-08-05 NOTE — Anesthesia Procedure Notes (Addendum)
Procedure Name: LMA Insertion Date/Time: 08/05/2015 8:30 AM Performed by: Dorie RankQUINN, Kelsey Geisler M Pre-anesthesia Checklist: Patient identified, Emergency Drugs available, Suction available, Patient being monitored and Timeout performed Patient Re-evaluated:Patient Re-evaluated prior to inductionOxygen Delivery Method: Circle system utilized Preoxygenation: Pre-oxygenation with 100% oxygen Intubation Type: IV induction Ventilation: Mask ventilation without difficulty LMA: LMA inserted LMA Size: 4.0 Number of attempts: 1 Placement Confirmation: positive ETCO2 and breath sounds checked- equal and bilateral Tube secured with: Tape Dental Injury: Teeth and Oropharynx as per pre-operative assessment

## 2015-08-05 NOTE — Anesthesia Postprocedure Evaluation (Signed)
Anesthesia Post Note  Patient: Jasmine PangKatherine M Rumberger  Procedure(s) Performed: Procedure(s) (LRB): IRRIGATION AND DEBRIDEMENT LEFT THIGH  (Left)  Patient location during evaluation: PACU Anesthesia Type: General Level of consciousness: awake and alert Pain management: pain level controlled Vital Signs Assessment: post-procedure vital signs reviewed and stable Respiratory status: spontaneous breathing, nonlabored ventilation and respiratory function stable Cardiovascular status: blood pressure returned to baseline and stable Postop Assessment: no signs of nausea or vomiting Anesthetic complications: no    Last Vitals:  Filed Vitals:   08/05/15 1100 08/05/15 1103  BP:  106/71  Pulse: 57 63  Temp:    Resp: 12 15    Last Pain:  Filed Vitals:   08/05/15 1110  PainSc: Asleep                 Aqil Goetting,W. EDMOND
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
## 2015-08-05 NOTE — Brief Op Note (Signed)
08/05/2015  9:19 AM  PATIENT:  Kelsey [name]  30 y.o. female  PRE-OPERATIVE DIAGNOSIS:   1. LEFT THIGH SEROMA 2. LEFT ANKLE PAIN  POST-OPERATIVE DIAGNOSIS:   1. LEFT THIGH SEROMA, 17CM X 8CM 2. LEFT ANKLE PAIN  PROCEDURE:  Procedure(s): 1. INCISION AND DRAINAGE LEFT THIGH SEROMA (Left) WITH CLOSURE OVER DRAIN 2. EVALUATION AND STRESS VIEWS UNDER FLOURO LEFT ANKLE  SURGEON:  Surgeon(s) and Role:    * Myrene GalasMichael Tyray Proch, MD - Primary  PHYSICIAN ASSISTANT: Montez MoritaKeith Paul, PA-C 2ND ASSISTANT: PA STUDENT  ANESTHESIA:   general  I/O:  Total I/O In: 700 [I.V.:700] Out: -   SPECIMEN:  No Specimen  TOURNIQUET:  * No tourniquets in log *  DICTATION: .Other Dictation: Dictation Number [id number]

## 2015-08-08 ENCOUNTER — Encounter (HOSPITAL_COMMUNITY): Payer: Self-pay | Admitting: Orthopedic Surgery

## 2015-08-16 ENCOUNTER — Other Ambulatory Visit: Payer: Self-pay | Admitting: Family Medicine

## 2016-03-14 ENCOUNTER — Ambulatory Visit: Payer: Medicaid Other | Admitting: Family Medicine

## 2016-03-22 ENCOUNTER — Ambulatory Visit: Payer: Medicaid Other

## 2016-03-29 ENCOUNTER — Encounter: Payer: Self-pay | Admitting: Family Medicine

## 2016-03-29 ENCOUNTER — Ambulatory Visit (INDEPENDENT_AMBULATORY_CARE_PROVIDER_SITE_OTHER): Payer: Self-pay | Admitting: Family Medicine

## 2016-03-29 VITALS — BP 110/68 | HR 88 | Temp 98.0°F | Ht 64.0 in | Wt 165.6 lb

## 2016-03-29 VITALS — BP 110/68 | HR 88 | Temp 98.0°F | Ht 64.0 in | Wt 165.0 lb

## 2016-03-29 DIAGNOSIS — Z975 Presence of (intrauterine) contraceptive device: Secondary | ICD-10-CM

## 2016-03-29 DIAGNOSIS — R300 Dysuria: Secondary | ICD-10-CM

## 2016-03-29 DIAGNOSIS — K649 Unspecified hemorrhoids: Secondary | ICD-10-CM

## 2016-03-29 DIAGNOSIS — Z3046 Encounter for surveillance of implantable subdermal contraceptive: Secondary | ICD-10-CM

## 2016-03-29 DIAGNOSIS — F32A Depression, unspecified: Secondary | ICD-10-CM

## 2016-03-29 DIAGNOSIS — Q210 Ventricular septal defect: Secondary | ICD-10-CM

## 2016-03-29 DIAGNOSIS — Z114 Encounter for screening for human immunodeficiency virus [HIV]: Secondary | ICD-10-CM

## 2016-03-29 DIAGNOSIS — Z202 Contact with and (suspected) exposure to infections with a predominantly sexual mode of transmission: Secondary | ICD-10-CM

## 2016-03-29 DIAGNOSIS — M21952 Unspecified acquired deformity of left thigh: Secondary | ICD-10-CM

## 2016-03-29 DIAGNOSIS — F329 Major depressive disorder, single episode, unspecified: Secondary | ICD-10-CM

## 2016-03-29 DIAGNOSIS — Z13220 Encounter for screening for lipoid disorders: Secondary | ICD-10-CM

## 2016-03-29 DIAGNOSIS — Z30017 Encounter for initial prescription of implantable subdermal contraceptive: Secondary | ICD-10-CM

## 2016-03-29 LAB — POCT URINALYSIS DIPSTICK
BILIRUBIN UA: NEGATIVE
Blood, UA: NEGATIVE
GLUCOSE UA: NEGATIVE
Ketones, UA: NEGATIVE
Nitrite, UA: NEGATIVE
Protein, UA: NEGATIVE
SPEC GRAV UA: 1.01
UROBILINOGEN UA: 0.2
pH, UA: 6

## 2016-03-29 LAB — POCT URINE PREGNANCY: PREG TEST UR: NEGATIVE

## 2016-03-29 LAB — POCT UA - MICROSCOPIC ONLY

## 2016-03-29 LAB — LIPID PANEL
CHOL/HDL RATIO: 2.7 ratio (ref <5.0)
CHOLESTEROL: 145 mg/dL (ref <200)
HDL: 53 mg/dL (ref >50)
LDL Cholesterol: 83 mg/dL (ref <100)
Triglycerides: 44 mg/dL (ref <150)
VLDL: 9 mg/dL (ref <30)

## 2016-03-29 LAB — HIV ANTIBODY (ROUTINE TESTING W REFLEX): HIV: NONREACTIVE

## 2016-03-29 MED ORDER — VENLAFAXINE HCL ER 150 MG PO CP24: 150.0000 mg | ORAL_CAPSULE | Freq: Every day | ORAL | 3 refills | Status: DC
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
## 2016-03-29 NOTE — Progress Notes (Signed)
Pregnancy test negative PROCEDURE NOTE: NEXPLANON  REMOVAL and REINSERTION  REMOVAA Patient given informed consent and signed copy in the chart for both procedures. an appropriate time out was been taken Pregnancy test negcative LEFT arm area prepped and draped in the usual sterile fashion. Three cc of 1% lidocaine without epinephrine used for local anesthesia. A small stab incision was made close to the nexplanon with scalpel. Hemostats were used to withdraw the nexplanon  INSERTION new NEXPLANON  Prior to the removal procedure, an appropriate time out had been taken and  there were no breaks in patient care between the procedures. Sterile field had been maintained as well as local anesthesia.   Nexplanon was inserted in typical fashion in the  LEFT ARM (same site previously used) and where removal was performed. There were no complications.  Pressure bandage was  applied to decrease bruising. Patient given follow up instructions should she experience redness, swelling at sight or fever in the next 24 hours. Patient given Nexplanon pocket card.

## 2016-03-29 NOTE — Assessment & Plan Note (Signed)
Bump dose of effexor.

## 2016-03-29 NOTE — Patient Instructions (Signed)
You are due for a pap smear. I refilled your antidepressant at a higher dose. I put in surgery and cardiology consults. I will call with lab results. Only use the antibiotic if the painful urination gets worse.

## 2016-03-29 NOTE — Assessment & Plan Note (Signed)
Due for removal

## 2016-03-29 NOTE — Assessment & Plan Note (Signed)
Given cheating husband, will rescreen for HIV.

## 2016-03-29 NOTE — Assessment & Plan Note (Signed)
Discussed with patient. Doubt the asymetry is related to her thigh pain.  Removal of presumed lipoma would be for cosmetic reasons.  She declines at present.

## 2016-03-29 NOTE — Assessment & Plan Note (Signed)
Wants to reestablish with adult cardiology.

## 2016-03-29 NOTE — Progress Notes (Signed)
Patient ID: Kelsey [name], female   DOB: 1985-08-02, 31 y.o.   MRN: [id number]

## 2016-03-29 NOTE — Assessment & Plan Note (Signed)
Refer to general surgery.

## 2016-03-30 MED ORDER — ETONOGESTREL 68 MG ~~LOC~~ IMPL
68.0000 mg | DRUG_IMPLANT | Freq: Once | SUBCUTANEOUS | Status: AC
  Administered 2016-03-29: 68 mg via SUBCUTANEOUS

## 2016-04-24 ENCOUNTER — Ambulatory Visit: Payer: Self-pay | Admitting: Surgery
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
## 2016-05-13 DIAGNOSIS — K649 Unspecified hemorrhoids: Secondary | ICD-10-CM

## 2016-05-13 HISTORY — DX: Unspecified hemorrhoids: K64.9

## 2016-05-16 ENCOUNTER — Encounter (HOSPITAL_BASED_OUTPATIENT_CLINIC_OR_DEPARTMENT_OTHER): Payer: Self-pay | Admitting: *Deleted

## 2016-05-17 ENCOUNTER — Encounter (HOSPITAL_BASED_OUTPATIENT_CLINIC_OR_DEPARTMENT_OTHER): Payer: Self-pay | Admitting: *Deleted

## 2016-05-17 NOTE — Pre-Procedure Instructions (Signed)
History of VSD discussed with Dr. Maple Hudson; why did pt. ask for cardiology referral at last PCP visit?  Pt. states that she now has insurance and decided to see cardiology for followup; is having no heart-related symptoms.  Pt. OK to come for surgery, per Dr. Maple Hudson.

## 2016-05-23 ENCOUNTER — Ambulatory Visit (HOSPITAL_BASED_OUTPATIENT_CLINIC_OR_DEPARTMENT_OTHER)
Admission: RE | Admit: 2016-05-23 | Discharge: 2016-05-23 | Disposition: A | Discharge status: Home / Self Care | Payer: Medicaid Other | Source: Ambulatory Visit | Attending: Surgery | Admitting: Surgery

## 2016-05-23 ENCOUNTER — Ambulatory Visit (HOSPITAL_BASED_OUTPATIENT_CLINIC_OR_DEPARTMENT_OTHER): Payer: Medicaid Other | Admitting: Anesthesiology

## 2016-05-23 ENCOUNTER — Encounter (HOSPITAL_BASED_OUTPATIENT_CLINIC_OR_DEPARTMENT_OTHER): Payer: Self-pay | Admitting: Anesthesiology

## 2016-05-23 ENCOUNTER — Encounter (HOSPITAL_BASED_OUTPATIENT_CLINIC_OR_DEPARTMENT_OTHER)
Admission: RE | Disposition: A | Discharge status: Home / Self Care | Payer: Self-pay | Source: Ambulatory Visit | Attending: Surgery

## 2016-05-23 DIAGNOSIS — K644 Residual hemorrhoidal skin tags: Secondary | ICD-10-CM | POA: Diagnosis not present

## 2016-05-23 DIAGNOSIS — F419 Anxiety disorder, unspecified: Secondary | ICD-10-CM | POA: Diagnosis not present

## 2016-05-23 DIAGNOSIS — Z8249 Family history of ischemic heart disease and other diseases of the circulatory system: Secondary | ICD-10-CM | POA: Diagnosis not present

## 2016-05-23 DIAGNOSIS — Z8349 Family history of other endocrine, nutritional and metabolic diseases: Secondary | ICD-10-CM | POA: Diagnosis not present

## 2016-05-23 DIAGNOSIS — F329 Major depressive disorder, single episode, unspecified: Secondary | ICD-10-CM | POA: Insufficient documentation

## 2016-05-23 DIAGNOSIS — Z886 Allergy status to analgesic agent status: Secondary | ICD-10-CM | POA: Insufficient documentation

## 2016-05-23 DIAGNOSIS — Z9889 Other specified postprocedural states: Secondary | ICD-10-CM | POA: Insufficient documentation

## 2016-05-23 DIAGNOSIS — Z832 Family history of diseases of the blood and blood-forming organs and certain disorders involving the immune mechanism: Secondary | ICD-10-CM | POA: Diagnosis not present

## 2016-05-23 DIAGNOSIS — Z8371 Family history of colonic polyps: Secondary | ICD-10-CM | POA: Insufficient documentation

## 2016-05-23 DIAGNOSIS — Z818 Family history of other mental and behavioral disorders: Secondary | ICD-10-CM | POA: Insufficient documentation

## 2016-05-23 DIAGNOSIS — F17200 Nicotine dependence, unspecified, uncomplicated: Secondary | ICD-10-CM | POA: Diagnosis not present

## 2016-05-23 DIAGNOSIS — Z793 Long term (current) use of hormonal contraceptives: Secondary | ICD-10-CM | POA: Diagnosis not present

## 2016-05-23 DIAGNOSIS — G43909 Migraine, unspecified, not intractable, without status migrainosus: Secondary | ICD-10-CM | POA: Insufficient documentation

## 2016-05-23 DIAGNOSIS — Z811 Family history of alcohol abuse and dependence: Secondary | ICD-10-CM | POA: Diagnosis not present

## 2016-05-23 DIAGNOSIS — K648 Other hemorrhoids: Secondary | ICD-10-CM | POA: Insufficient documentation

## 2016-05-23 HISTORY — PX: HEMORRHOID SURGERY: SHX153

## 2016-05-23 LAB — POCT HEMOGLOBIN-HEMACUE: HEMOGLOBIN: 14 g/dL (ref 12.0–15.0)

## 2016-05-23 SURGERY — HEMORRHOIDECTOMY
Anesthesia: General | Site: Anus

## 2016-05-23 MED ORDER — FENTANYL CITRATE (PF) 100 MCG/2ML IJ SOLN: 50.0000 ug | INTRAMUSCULAR | Status: DC | PRN

## 2016-05-23 MED ORDER — DOCUSATE SODIUM 100 MG PO CAPS: 100.0000 mg | ORAL_CAPSULE | Freq: Two times a day (BID) | ORAL | 0 refills | Status: AC

## 2016-05-23 MED ORDER — CEFAZOLIN SODIUM-DEXTROSE 2-4 GM/100ML-% IV SOLN
2.0000 g | INTRAVENOUS | Status: AC
  Administered 2016-05-23: 2 g via INTRAVENOUS

## 2016-05-23 MED ORDER — MIDAZOLAM HCL 5 MG/5ML IJ SOLN
INTRAMUSCULAR | Status: DC | PRN
  Administered 2016-05-23: 2 mg via INTRAVENOUS

## 2016-05-23 MED ORDER — HYDROMORPHONE HCL 1 MG/ML IJ SOLN: 0.2500 mg | INTRAMUSCULAR | Status: DC | PRN

## 2016-05-23 MED ORDER — GABAPENTIN 300 MG PO CAPS
ORAL_CAPSULE | ORAL | Status: AC
  Filled 2016-05-23: qty 1

## 2016-05-23 MED ORDER — DIBUCAINE 1 % RE OINT
TOPICAL_OINTMENT | RECTAL | Status: AC
  Filled 2016-05-23: qty 28

## 2016-05-23 MED ORDER — ACETAMINOPHEN 325 MG PO TABS: 650.0000 mg | ORAL_TABLET | ORAL | Status: DC | PRN

## 2016-05-23 MED ORDER — CHLORHEXIDINE GLUCONATE CLOTH 2 % EX PADS: 6.0000 | MEDICATED_PAD | Freq: Once | CUTANEOUS | Status: DC

## 2016-05-23 MED ORDER — BUPIVACAINE LIPOSOME 1.3 % IJ SUSP
INTRAMUSCULAR | Status: AC
  Filled 2016-05-23: qty 20

## 2016-05-23 MED ORDER — SODIUM CHLORIDE 0.9 % IV SOLN
INTRAVENOUS | Status: DC | PRN
  Administered 2016-05-23: 40 mL

## 2016-05-23 MED ORDER — ACETAMINOPHEN 650 MG RE SUPP: 650.0000 mg | RECTAL | Status: DC | PRN

## 2016-05-23 MED ORDER — MIDAZOLAM HCL 2 MG/2ML IJ SOLN: 1.0000 mg | INTRAMUSCULAR | Status: DC | PRN

## 2016-05-23 MED ORDER — ONDANSETRON HCL 4 MG/2ML IJ SOLN
INTRAMUSCULAR | Status: AC
  Filled 2016-05-23: qty 2

## 2016-05-23 MED ORDER — SODIUM CHLORIDE 0.9% FLUSH: 3.0000 mL | Freq: Two times a day (BID) | INTRAVENOUS | Status: DC

## 2016-05-23 MED ORDER — SUCCINYLCHOLINE CHLORIDE 200 MG/10ML IV SOSY
PREFILLED_SYRINGE | INTRAVENOUS | Status: AC
  Filled 2016-05-23: qty 10

## 2016-05-23 MED ORDER — EPHEDRINE 5 MG/ML INJ
INTRAVENOUS | Status: AC
  Filled 2016-05-23: qty 10

## 2016-05-23 MED ORDER — FENTANYL CITRATE (PF) 100 MCG/2ML IJ SOLN
INTRAMUSCULAR | Status: AC
  Filled 2016-05-23: qty 2

## 2016-05-23 MED ORDER — LIDOCAINE 2% (20 MG/ML) 5 ML SYRINGE
INTRAMUSCULAR | Status: AC
  Filled 2016-05-23: qty 5

## 2016-05-23 MED ORDER — ACETAMINOPHEN 500 MG PO TABS
ORAL_TABLET | ORAL | Status: AC
  Filled 2016-05-23: qty 2

## 2016-05-23 MED ORDER — EPHEDRINE SULFATE 50 MG/ML IJ SOLN
INTRAMUSCULAR | Status: DC | PRN
  Administered 2016-05-23: 25 mg via INTRAVENOUS

## 2016-05-23 MED ORDER — SCOPOLAMINE 1 MG/3DAYS TD PT72: 1.0000 | MEDICATED_PATCH | Freq: Once | TRANSDERMAL | Status: DC | PRN

## 2016-05-23 MED ORDER — SUCCINYLCHOLINE CHLORIDE 20 MG/ML IJ SOLN
INTRAMUSCULAR | Status: DC | PRN
  Administered 2016-05-23: 50 mg via INTRAVENOUS

## 2016-05-23 MED ORDER — ACETAMINOPHEN 500 MG PO TABS
1000.0000 mg | ORAL_TABLET | ORAL | Status: AC
  Administered 2016-05-23: 1000 mg via ORAL

## 2016-05-23 MED ORDER — OXYCODONE-ACETAMINOPHEN 5-325 MG PO TABS: 1.0000 | ORAL_TABLET | Freq: Four times a day (QID) | ORAL | 0 refills | Status: DC | PRN

## 2016-05-23 MED ORDER — BUPIVACAINE HCL (PF) 0.25 % IJ SOLN
INTRAMUSCULAR | Status: AC
  Filled 2016-05-23: qty 30

## 2016-05-23 MED ORDER — PROPOFOL 10 MG/ML IV BOLUS
INTRAVENOUS | Status: AC
  Filled 2016-05-23: qty 20

## 2016-05-23 MED ORDER — DEXAMETHASONE SODIUM PHOSPHATE 10 MG/ML IJ SOLN
INTRAMUSCULAR | Status: AC
  Filled 2016-05-23: qty 1

## 2016-05-23 MED ORDER — FLEET ENEMA 7-19 GM/118ML RE ENEM: 1.0000 | ENEMA | Freq: Once | RECTAL | Status: DC

## 2016-05-23 MED ORDER — MIDAZOLAM HCL 2 MG/2ML IJ SOLN
INTRAMUSCULAR | Status: AC
  Filled 2016-05-23: qty 2

## 2016-05-23 MED ORDER — ONDANSETRON HCL 4 MG/2ML IJ SOLN: 4.0000 mg | Freq: Once | INTRAMUSCULAR | Status: DC | PRN

## 2016-05-23 MED ORDER — FENTANYL CITRATE (PF) 100 MCG/2ML IJ SOLN
INTRAMUSCULAR | Status: DC | PRN
  Administered 2016-05-23: 100 ug via INTRAVENOUS

## 2016-05-23 MED ORDER — SODIUM CHLORIDE 0.9 % IJ SOLN
INTRAMUSCULAR | Status: AC
  Filled 2016-05-23: qty 20

## 2016-05-23 MED ORDER — FENTANYL CITRATE (PF) 100 MCG/2ML IJ SOLN: 25.0000 ug | INTRAMUSCULAR | Status: DC | PRN

## 2016-05-23 MED ORDER — CEFAZOLIN SODIUM-DEXTROSE 2-4 GM/100ML-% IV SOLN
INTRAVENOUS | Status: AC
  Filled 2016-05-23: qty 100

## 2016-05-23 MED ORDER — MEPERIDINE HCL 25 MG/ML IJ SOLN: 6.2500 mg | INTRAMUSCULAR | Status: DC | PRN

## 2016-05-23 MED ORDER — SODIUM CHLORIDE 0.9% FLUSH: 3.0000 mL | INTRAVENOUS | Status: DC | PRN

## 2016-05-23 MED ORDER — ONDANSETRON HCL 4 MG/2ML IJ SOLN
INTRAMUSCULAR | Status: DC | PRN
  Administered 2016-05-23: 4 mg via INTRAVENOUS

## 2016-05-23 MED ORDER — OXYCODONE HCL 5 MG PO TABS: 5.0000 mg | ORAL_TABLET | ORAL | Status: DC | PRN

## 2016-05-23 MED ORDER — LACTATED RINGERS IV SOLN
INTRAVENOUS | Status: DC
  Administered 2016-05-23 (×2): via INTRAVENOUS

## 2016-05-23 MED ORDER — GABAPENTIN 300 MG PO CAPS
300.0000 mg | ORAL_CAPSULE | ORAL | Status: AC
  Administered 2016-05-23: 300 mg via ORAL

## 2016-05-23 MED ORDER — SODIUM CHLORIDE 0.9 % IV SOLN: 250.0000 mL | INTRAVENOUS | Status: DC | PRN

## 2016-05-23 SURGICAL SUPPLY — 45 items
BLADE HEX COATED 2.75 (ELECTRODE) ×3 IMPLANT
BLADE SURG 11 STRL SS (BLADE) IMPLANT
BLADE SURG 15 STRL LF DISP TIS (BLADE) ×1 IMPLANT
BLADE SURG 15 STRL SS (BLADE) ×3
BRIEF STRETCH FOR OB PAD LRG (UNDERPADS AND DIAPERS) ×3 IMPLANT
CANISTER SUCT 1200ML W/VALVE (MISCELLANEOUS) ×3 IMPLANT
COVER BACK TABLE 60X90IN (DRAPES) ×2 IMPLANT
COVER MAYO STAND STRL (DRAPES) ×3 IMPLANT
DECANTER SPIKE VIAL GLASS SM (MISCELLANEOUS) IMPLANT
DRAPE LAPAROTOMY 100X72 PEDS (DRAPES) ×2 IMPLANT
DRAPE UTILITY XL STRL (DRAPES) ×3 IMPLANT
DRSG PAD ABDOMINAL 8X10 ST (GAUZE/BANDAGES/DRESSINGS) ×3 IMPLANT
ELECT REM PT RETURN 9FT ADLT (ELECTROSURGICAL) ×3
ELECTRODE REM PT RTRN 9FT ADLT (ELECTROSURGICAL) ×1 IMPLANT
GAUZE SPONGE 4X4 12PLY STRL LF (GAUZE/BANDAGES/DRESSINGS) IMPLANT
GAUZE SPONGE 4X4 16PLY XRAY LF (GAUZE/BANDAGES/DRESSINGS) IMPLANT
GLOVE BIO SURGEON STRL SZ 6 (GLOVE) ×3 IMPLANT
GLOVE BIOGEL PI IND STRL 6.5 (GLOVE) ×1 IMPLANT
GLOVE BIOGEL PI IND STRL 7.0 (GLOVE) IMPLANT
GLOVE BIOGEL PI INDICATOR 6.5 (GLOVE) ×2
GLOVE BIOGEL PI INDICATOR 7.0 (GLOVE) ×4
GLOVE ECLIPSE 6.5 STRL STRAW (GLOVE) ×2 IMPLANT
GOWN STRL REUS W/ TWL LRG LVL3 (GOWN DISPOSABLE) ×2 IMPLANT
GOWN STRL REUS W/TWL LRG LVL3 (GOWN DISPOSABLE) ×6
NDL HYPO 25X1 1.5 SAFETY (NEEDLE) ×1 IMPLANT
NEEDLE HYPO 25X1 1.5 SAFETY (NEEDLE) ×3 IMPLANT
NS IRRIG 1000ML POUR BTL (IV SOLUTION) ×3 IMPLANT
PACK BASIN DAY SURGERY FS (CUSTOM PROCEDURE TRAY) ×3 IMPLANT
PACK LITHOTOMY IV (CUSTOM PROCEDURE TRAY) ×1 IMPLANT
PENCIL BUTTON HOLSTER BLD 10FT (ELECTRODE) ×3 IMPLANT
SHEARS HARMONIC 9CM CVD (BLADE) ×3 IMPLANT
SLEEVE SCD COMPRESS KNEE MED (MISCELLANEOUS) ×3 IMPLANT
SPONGE LAP 4X18 X RAY DECT (DISPOSABLE) ×3 IMPLANT
SPONGE SURGIFOAM ABS GEL 100 (HEMOSTASIS) ×2 IMPLANT
SURGILUBE 2OZ TUBE FLIPTOP (MISCELLANEOUS) ×3 IMPLANT
SUT CHROMIC 3 0 SH 27 (SUTURE) ×1 IMPLANT
SYR CONTROL 10ML LL (SYRINGE) ×3 IMPLANT
TOWEL OR 17X24 6PK STRL BLUE (TOWEL DISPOSABLE) ×3 IMPLANT
TOWEL OR NON WOVEN STRL DISP B (DISPOSABLE) ×3 IMPLANT
TRAY DSU PREP LF (CUSTOM PROCEDURE TRAY) ×3 IMPLANT
TRAY PROCTOSCOPIC FIBER OPTIC (SET/KITS/TRAYS/PACK) IMPLANT
TUBE CONNECTING 20'X1/4 (TUBING) ×1
TUBE CONNECTING 20X1/4 (TUBING) ×2 IMPLANT
UNDERPAD 30X30 (UNDERPADS AND DIAPERS) ×3 IMPLANT
YANKAUER SUCT BULB TIP NO VENT (SUCTIONS) ×3 IMPLANT

## 2016-05-23 NOTE — Op Note (Signed)
Operative Note  Kelsey [name]  [id number]  [id number]  05/23/2016   Surgeon: Berna Bue  Assistant: OR staff  Procedure performed: 2-column hemorrhoidectomy: right anterior, right posterior  Preop diagnosis: hemorroids Post-op diagnosis/intraop findings: Three-column external>internal hemorrhoid disease with large decompressed skin tags  Specimens: Right anterior hemorrhoids, right posterior hemorrhoids Retained items: gelfoam packing will exit with first bowel movement EBL: <10cc Complications: none  Description of procedure: After obtaining informed consent the patient was taken to the operating room and placed supine on operating room table wheregeneral endotracheal anesthesia was initiated, preoperative antibiotics were administered, SCDs applied, and a formal timeout was performed. She was repositioned in prone jack-knife with all pressure points appropriately padded. The buttocks were taped apart and the anus and perineum were prepped and draped with betadine. Examination revealed three-column external hemorroids with large decompressed skin tags, minimal internal hemorroidal disease. No other abnormalities were present. The right posterior column was lifted with a hemostat each on the internal and external component. A perianal block was performed with exparel. The skin was incised with tenotomy scissors just above where the external component shouldered onto the perianal skin. The skin and mucosa were bluntly dissected on either side of the hemorroid complex. A harmonic scalpel was used to divide the skin and mucosa on either side and then the vein centrally, ensuring no injury to the sphincter below. The external and internal component were excised together. Hemostasis in the resultant wound was ensured with cautery. In a similar fashion, the right anterior hemorrhoid complex was then excised. There was a skin bridge of 1.5cm between the two excision sites. Again  hemostasis was ensured. The rectum was packed with a gelfoam roll. Dry dressing and mesh panties were then placed. The patient was returned to the supine position. The patient was then awakened, extubated and taken to PACU in stable condition.   All counts were correct at the completion of the case.

## 2016-05-23 NOTE — Anesthesia Procedure Notes (Signed)
Procedure Name: Intubation Date/Time: 05/23/2016 10:35 AM Performed by: [place] Desanctis Pre-anesthesia Checklist: Patient identified, Emergency Drugs available, Suction available, Patient being monitored and Timeout performed Patient Re-evaluated:Patient Re-evaluated prior to inductionOxygen Delivery Method: Circle system utilized Preoxygenation: Pre-oxygenation with 100% oxygen Intubation Type: IV induction Ventilation: Mask ventilation without difficulty Laryngoscope Size: Miller and 3 Grade View: Grade II Tube type: Oral Tube size: 7.0 mm Number of attempts: 1 Airway Equipment and Method: Stylet and Oral airway Placement Confirmation: ETT inserted through vocal cords under direct vision,  positive ETCO2 and breath sounds checked- equal and bilateral Secured at: 21.5 cm Tube secured with: Tape Dental Injury: Teeth and Oropharynx as per pre-operative assessment

## 2016-05-23 NOTE — Discharge Instructions (Signed)
ANORECTAL SURGERY: POST OP INSTRUCTIONS 1. Take your usually prescribed home medications unless otherwise directed. 2. DIET: During the first few hours after surgery sip on some liquids until you are able to urinate.  It is normal to not urinate for several hours after this surgery.  If you feel uncomfortable, please contact the office for instructions.  After you are able to urinate,you may eat, if you feel like it.  Follow a light bland diet the first 24 hours after arrival home, such as soup, liquids, crackers, etc.  Be sure to include lots of fluids daily (6-8 glasses).  Avoid fast food or heavy meals, as your are more likely to get nauseated.  Eat a low fat diet the next few days after surgery.  Limit caffeine intake to 1-2 servings a day. 3. PAIN CONTROL: a. Pain is best controlled by a usual combination of several different methods TOGETHER: i. Muscle relaxation  Soak in a warm bath (or Sitz bath) three times a day and after bowel movements.  Continue to do this until all pain is resolved.  ii. Over the counter pain medication iii. Prescription pain medication b. Most patients will experience some swelling and discomfort in the anus/rectal area and incisions.  Heat such as warm towels, sitz baths, warm baths, etc to help relax tight/sore spots and speed recovery.  Some people prefer to use ice, especially in the first couple days after surgery, as it may decrease the pain and swelling, or alternate between ice & heat.  Experiment to what works for you.  Swelling and bruising can take several weeks to resolve.  Pain can take even longer to completely resolve. c. It is helpful to take an over-the-counter pain medication regularly for the first few weeks.  Choose one of the following that works best for you: i. Naproxen (Aleve, etc)  Two  tabs twice a day ii. Ibuprofen (Advil, etc) Three  tabs four times a day (every meal & bedtime) d. A  prescription for pain medication (such as  percocet, oxycodone, hydrocodone, etc) should be given to you upon discharge.  Take your pain medication as prescribed.  i. If you are having problems/concerns with the prescription medicine (does not control pain, nausea, vomiting, rash, itching, etc), please call us [phone] to see if we need to switch you to a different pain medicine that will work better for you and/or control your side effect better. ii. If you need a refill on your pain medication, please contact your pharmacy.  They will contact our office to request authorization. Prescriptions will not be filled after 5 pm or on week-ends. 4. KEEP YOUR BOWELS REGULAR and AVOID CONSTIPATION a. The goal is one to two soft bowel movements a day.  You should at least have a bowel movement every other day. b. Avoid getting constipated.  Between the surgery and the pain medications, it is common to experience some constipation. This can be very painful after rectal surgery.  Increasing fluid intake and taking a fiber supplement (such as Metamucil, Citrucel, FiberCon, etc) 1-2 times a day regularly will usually help prevent this problem from occurring.  A stool softener like colace is also recommended.  This can be purchased over the counter at your pharmacy.  You can take it up to 3 times a day.  If you do not have a bowel movement after 24 hrs since your surgery, take one does of milk of magnesia.  If you still haven't had a bowel movement 8-12  hours after that dose, take another dose.  If you don't have a bowel movement 48 hrs after surgery, purchase a Fleets enema from the drug store and administer gently per package instructions.  If you still are having trouble with your bowel movements after that, please call the office for further instructions. c. If you develop diarrhea or have many loose bowel movements, simplify your diet to bland foods & liquids for a few days.  Stop any stool softeners and decrease your fiber supplement.  Switching to mild  anti-diarrheal medications (Kayopectate, Pepto Bismol) can help.  If this worsens or does not improve, please call us.  5. Wound Care a. Remove your bandages before your first bowel movement or 8 hours after surgery.     b. Remove any wound packing material at this time as well.  You do not need to repack the wound unless instructed otherwise.  Wear an absorbent pad or soft cotton gauze in your underwear to catch any drainage and help keep the area clean. You should change this every 2-3 hours while awake. c. Keep the area clean and dry.  Bathe / shower every day, especially after bowel movements.  Keep the area clean by showering / bathing over the incision / wound.   It is okay to soak an open wound to help wash it.  Wet wipes or showers / gentle washing after bowel movements is often less traumatic than regular toilet paper. d. Bonita Quin may have some styrofoam-like soft packing in the rectum which will come out with the first bowel movement.  e. You will often notice bleeding with bowel movements.  This should slow down by the end of the first week of surgery f. Expect some drainage.  This should slow down, too, by the end of the first week of surgery.  Wear an absorbent pad or soft cotton gauze in your underwear until the drainage stops. g. Do Not sit on a rubber or pillow ring.  This can make you symptoms worse.  You may sit on a soft pillow if needed.  6. ACTIVITIES as tolerated:   a. You may resume regular (light) daily activities beginning the next day--such as daily self-care, walking, climbing stairs--gradually increasing activities as tolerated.  If you can walk 30 minutes without difficulty, it is safe to try more intense activity such as jogging, treadmill, bicycling, low-impact aerobics, swimming, etc. b. Save the most intensive and strenuous activity for last such as sit-ups, heavy lifting, contact sports, etc  Refrain from any heavy lifting or straining until you are off narcotics for pain  control.   c. You may drive when you are no longer taking prescription pain medication, you can comfortably sit for long periods of time, and you can safely maneuver your car and apply brakes. d. Bonita Quin may have sexual intercourse when it is comfortable.  7. FOLLOW UP in our office a. Please call [facility] at [phone] to set up an appointment to see your surgeon in the office for a follow-up appointment approximately 3-4 weeks after your surgery. b. Make sure that you call for this appointment the day you arrive home to insure a convenient appointment time. 10. IF YOU HAVE DISABILITY OR FAMILY LEAVE FORMS, BRING THEM TO THE OFFICE FOR PROCESSING.  DO NOT GIVE THEM TO YOUR DOCTOR.     WHEN TO CALL us [phone]: 1. Poor pain control 2. Reactions / problems with new medications (rash/itching, nausea, etc)  3. Fever over 101.5 F (38.5  C) 4. Inability to urinate 5. Nausea and/or vomiting 6. Worsening swelling or bruising 7. Continued bleeding from incision. 8. Increased pain, redness, or drainage from the incision  The clinic staff is available to answer your questions during regular business hours (8:30am-5pm).  Please dont hesitate to call and ask to speak to one of our nurses for clinical concerns.   A surgeon from [facility] is always on call at the hospitals   If you have a medical emergency, go to the nearest emergency room or call 911.    [facility] [address], [address], [place], [place]  [address] ? MAIN: [phone]) [phone] ? TOLL FREE: [phone] ? FAX [phone] [email or web address]    Post Anesthesia Home Care Instructions  Activity: Get plenty of rest for the remainder of the day. A responsible individual must stay with you for 24 hours following the procedure.  For the next 24 hours, DO NOT: -Drive a car -Advertising copywriter -Drink alcoholic beverages -Take any medication unless instructed by your  physician -Make any legal decisions or sign important papers.  Meals: Start with liquid foods such as gelatin or soup. Progress to regular foods as tolerated. Avoid greasy, spicy, heavy foods. If nausea and/or vomiting occur, drink only clear liquids until the nausea and/or vomiting subsides. Call your physician if vomiting continues.  Special Instructions/Symptoms: Your throat may feel dry or sore from the anesthesia or the breathing tube placed in your throat during surgery. If this causes discomfort, gargle with warm salt water. The discomfort should disappear within 24 hours.  If you had a scopolamine patch placed behind your ear for the management of post- operative nausea and/or vomiting:  1. The medication in the patch is effective for 72 hours, after which it should be removed.  Wrap patch in a tissue and discard in the trash. Wash hands thoroughly with soap and water. 2. You may remove the patch earlier than 72 hours if you experience unpleasant side effects which may include dry mouth, dizziness or visual disturbances. 3. Avoid touching the patch. Wash your hands with soap and water after contact with the patch.   Call your surgeon if you experience:   1.  Fever over 101.0. 2.  Inability to urinate. 3.  Nausea and/or vomiting. 4.  Extreme swelling or bruising at the surgical site. 5.  Continued bleeding from the incision. 6.  Increased pain, redness or drainage from the incision. 7.  Problems related to your pain medication. 8.  Any problems and/or concerns  Information for Discharge Teaching: EXPAREL (bupivacaine liposome injectable suspension)   Your surgeon gave you EXPAREL(bupivacaine) in your surgical incision to help control your pain after surgery.   EXPAREL is a local anesthetic that provides pain relief by numbing the tissue around the surgical site.  EXPAREL is designed to release pain medication over time and can control pain for up to 72 hours.  Depending on  how you respond to EXPAREL, you may require less pain medication during your recovery.  Possible side effects:  Temporary loss of sensation or ability to move in the area where bupivacaine was injected.  Nausea, vomiting, constipation  Rarely, numbness and tingling in your mouth or lips, lightheadedness, or anxiety may occur.  Call your doctor right away if you think you may be experiencing any of these sensations, or if you have other questions regarding possible side effects.  Follow all other discharge instructions given to you by your surgeon or nurse. Eat  a healthy diet and drink plenty of water or other fluids.  If you return to the hospital for any reason within 96 hours following the administration of EXPAREL, please inform your health care providers.

## 2016-05-23 NOTE — Interval H&P Note (Signed)
History and Physical Interval Note:  05/23/2016 10:00 AM  Kelsey [name]  has presented today for surgery, with the diagnosis of Hemorrhoids  The various methods of treatment have been discussed with the patient and family. After consideration of risks, benefits and other options for treatment, the patient has consented to  Procedure(s): HEMORRHOIDECTOMY (N/A) as a surgical intervention .  The patient's history has been reviewed, patient examined, no change in status, stable for surgery.  I have reviewed the patient's chart and labs.  Questions were answered to the patient's satisfaction.     Shateka Petrea Lollie Sails
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
## 2016-05-23 NOTE — Transfer of Care (Signed)
Immediate Anesthesia Transfer of Care Note  Patient: Kelsey [name]  Procedure(s) Performed: Procedure(s): HEMORRHOIDECTOMY (N/A)  Patient Location: PACU  Anesthesia Type:General  Level of Consciousness: awake and patient cooperative  Airway & Oxygen Therapy: Patient Spontanous Breathing and Patient connected to face mask oxygen  Post-op Assessment: Report given to RN and Post -op Vital signs reviewed and stable  Post vital signs: Reviewed and stable  Last Vitals:  Vitals:   05/23/16 0938 05/23/16 1126  BP: 115/64 128/69  Pulse: 75 85  Resp: 16   Temp: 36.8 C (P) 36.7 C    Last Pain:  Vitals:   05/23/16 0938  TempSrc: Oral         Complications: No apparent anesthesia complications

## 2016-05-23 NOTE — Anesthesia Postprocedure Evaluation (Signed)
Anesthesia Post Note  Patient: Kelsey [name]  Procedure(s) Performed: Procedure(s) (LRB): HEMORRHOIDECTOMY (N/A)  Patient location during evaluation: PACU Anesthesia Type: General Level of consciousness: awake and alert Pain management: pain level controlled Vital Signs Assessment: post-procedure vital signs reviewed and stable Respiratory status: spontaneous breathing, nonlabored ventilation, respiratory function stable and patient connected to nasal cannula oxygen Cardiovascular status: blood pressure returned to baseline and stable Postop Assessment: no signs of nausea or vomiting Anesthetic complications: no       Last Vitals:  Vitals:   05/23/16 1200 05/23/16 1213  BP: 122/72 (!) 145/76  Pulse: 67 80  Resp: 10 16  Temp:  36.5 C    Last Pain:  Vitals:   05/23/16 1213  TempSrc: Oral  PainSc: 0-No pain                 Waldemar Siegel DAVID

## 2016-05-23 NOTE — Anesthesia Preprocedure Evaluation (Signed)
Anesthesia Evaluation  Patient identified by MRN, date of birth, ID band Patient awake    Reviewed: Allergy & Precautions, NPO status , Patient's Chart, lab work & pertinent test results  Airway Mallampati: I   Neck ROM: Full    Dental   Pulmonary Current Smoker,    Pulmonary exam normal        Cardiovascular Normal cardiovascular exam     Neuro/Psych    GI/Hepatic   Endo/Other    Renal/GU      Musculoskeletal   Abdominal   Peds  Hematology   Anesthesia Other Findings   Reproductive/Obstetrics                             Anesthesia Physical Anesthesia Plan  ASA: II  Anesthesia Plan: General   Post-op Pain Management:    Induction: Intravenous  Airway Management Planned: Oral ETT  Additional Equipment:   Intra-op Plan:   Post-operative Plan: Extubation in OR  Informed Consent: I have reviewed the patients History and Physical, chart, labs and discussed the procedure including the risks, benefits and alternatives for the proposed anesthesia with the patient or authorized representative who has indicated his/her understanding and acceptance.     Plan Discussed with: CRNA and Surgeon  Anesthesia Plan Comments:         Anesthesia Quick Evaluation

## 2016-05-24 ENCOUNTER — Encounter (HOSPITAL_BASED_OUTPATIENT_CLINIC_OR_DEPARTMENT_OTHER): Payer: Self-pay | Admitting: Surgery

## 2016-07-25 ENCOUNTER — Encounter: Payer: Self-pay | Admitting: Family Medicine

## 2016-07-25 ENCOUNTER — Ambulatory Visit (INDEPENDENT_AMBULATORY_CARE_PROVIDER_SITE_OTHER): Payer: Medicaid Other | Admitting: Family Medicine

## 2016-07-25 DIAGNOSIS — F329 Major depressive disorder, single episode, unspecified: Secondary | ICD-10-CM

## 2016-07-25 DIAGNOSIS — F32A Depression, unspecified: Secondary | ICD-10-CM

## 2016-07-25 MED ORDER — PHENTERMINE HCL 15 MG PO CAPS: 15.0000 mg | ORAL_CAPSULE | ORAL | 0 refills | Status: DC

## 2016-07-25 NOTE — Patient Instructions (Signed)
I will call with the referral information. One month only on the phentermine. See me in 3-4 weeks

## 2016-07-26 NOTE — Assessment & Plan Note (Signed)
Cont effexer.  I negotiated one month only of phentermine.  FU phone call [facility]  FU with me 3-4 weeks.
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
## 2016-09-27 ENCOUNTER — Other Ambulatory Visit: Payer: Self-pay | Admitting: Family Medicine

## 2016-09-27 DIAGNOSIS — F329 Major depressive disorder, single episode, unspecified: Secondary | ICD-10-CM

## 2016-09-27 DIAGNOSIS — F32A Depression, unspecified: Secondary | ICD-10-CM

## 2016-09-27 MED ORDER — VENLAFAXINE HCL ER 75 MG PO TB24: 75.0000 mg | ORAL_TABLET | Freq: Every day | ORAL | 3 refills | Status: DC

## 2017-09-03 ENCOUNTER — Telehealth: Payer: Self-pay | Admitting: Nurse Practitioner

## 2017-09-03 DIAGNOSIS — N3000 Acute cystitis without hematuria: Secondary | ICD-10-CM

## 2017-09-03 MED ORDER — NITROFURANTOIN MONOHYD MACRO 100 MG PO CAPS: 100.0000 mg | ORAL_CAPSULE | Freq: Two times a day (BID) | ORAL | 0 refills | Status: DC

## 2017-09-03 NOTE — Progress Notes (Signed)

## 2017-12-16 ENCOUNTER — Encounter: Payer: Self-pay | Admitting: Family Medicine

## 2017-12-16 ENCOUNTER — Other Ambulatory Visit: Payer: Self-pay | Admitting: Family Medicine

## 2017-12-16 DIAGNOSIS — F329 Major depressive disorder, single episode, unspecified: Secondary | ICD-10-CM

## 2017-12-16 DIAGNOSIS — F32A Depression, unspecified: Secondary | ICD-10-CM

## 2017-12-17 MED ORDER — VENLAFAXINE HCL ER 75 MG PO TB24: 75.0000 mg | ORAL_TABLET | Freq: Every day | ORAL | 3 refills | Status: DC

## 2017-12-17 NOTE — Telephone Encounter (Signed)
done

## 2018-06-07 ENCOUNTER — Encounter: Payer: Self-pay | Admitting: Family Medicine

## 2018-06-18 ENCOUNTER — Encounter: Payer: Self-pay | Admitting: Family Medicine

## 2018-06-18 ENCOUNTER — Telehealth: Payer: Self-pay | Admitting: Nurse Practitioner

## 2018-06-18 DIAGNOSIS — J069 Acute upper respiratory infection, unspecified: Secondary | ICD-10-CM

## 2018-06-18 NOTE — Progress Notes (Signed)
We are sorry you are not feeling well.  Here is how we plan to help!  Based on what you have shared with me, it looks like you may have a viral upper respiratory infection.  Upper respiratory infections are caused by a large number of viruses; however, rhinovirus is the most common cause.   Symptoms vary from person to person, with common symptoms including sore throat, cough, and fatigue or lack of energy.  A low-grade fever of up to 100.4 may present, but is often uncommon.  Symptoms vary however, and are closely related to a person's age or underlying illnesses.  The most common symptoms associated with an upper respiratory infection are nasal discharge or congestion, cough, sneezing, headache and pressure in the ears and face.  These symptoms usually persist for about 3 to 10 days, but can last up to 2 weeks.  It is important to know that upper respiratory infections do not cause serious illness or complications in most cases.    Upper respiratory infections can be transmitted from person to person, with the most common method of transmission being a person's hands.  The virus is able to live on the skin and can infect other persons for up to 2 hours after direct contact.  Also, these can be transmitted when someone coughs or sneezes; thus, it is important to cover the mouth to reduce this risk.  To keep the spread of the illness at bay, good hand hygiene is very important.  This is an infection that is most likely caused by a virus. There are no specific treatments other than to help you with the symptoms until the infection runs its course.  We are sorry you are not feeling well.  Here is how we plan to help!   For nasal congestion, you may use an oral decongestants such as Mucinex D or if you have glaucoma or high blood pressure use plain Mucinex.  Saline nasal spray or nasal drops can help and can safely be used as often as needed for congestion.    If you do not have a history of heart disease,  hypertension, diabetes or thyroid disease, prostate/bladder issues or glaucoma, you may also use Sudafed to treat nasal congestion.  It is highly recommended that you consult with a pharmacist or your primary care physician to ensure this medication is safe for you to take.     If you have a cough, you may use cough suppressants such as Delsym and Robitussin.  If you have glaucoma or high blood pressure, you can also use Coricidin HBP.    If you have a sore or scratchy throat, use a saltwater gargle-  to  teaspoon of salt dissolved in a 4-ounce to 8-ounce glass of warm water.  Gargle the solution for approximately 15-30 seconds and then spit.  It is important not to swallow the solution.  You can also use throat lozenges/cough drops and Chloraseptic spray to help with throat pain or discomfort.  Warm or cold liquids can also be helpful in relieving throat pain.  For headache, pain or general discomfort, you can use Ibuprofen or Tylenol as directed.   Some authorities believe that zinc sprays or the use of Echinacea may shorten the course of your symptoms.   HOME CARE . Only take medications as instructed by your medical team. . Be sure to drink plenty of fluids. Water is fine as well as fruit juices, sodas and electrolyte beverages. You may want to stay away  from caffeine or alcohol. If you are nauseated, try taking small sips of liquids. How do you know if you are getting enough fluid? Your urine should be a pale yellow or almost colorless. . Get rest. . Taking a steamy shower or using a humidifier may help nasal congestion and ease sore throat pain. You can place a towel over your head and breathe in the steam from hot water coming from a faucet. . Using a saline nasal spray works much the same way. . Cough drops, hard candies and sore throat lozenges may ease your cough. . Avoid close contacts especially the very young and the elderly . Cover your mouth if you cough or sneeze . Always  remember to wash your hands.   GET HELP RIGHT AWAY IF: . You develop worsening fever. . If your symptoms do not improve within 10 days . You develop yellow or green discharge from your nose over 3 days. . You have coughing fits . You develop a severe head ache or visual changes. . You develop shortness of breath, difficulty breathing or start having chest pain . Your symptoms persist after you have completed your treatment plan  MAKE SURE YOU   Understand these instructions.  Will watch your condition.  Will get help right away if you are not doing well or get worse.  Your e-visit answers were reviewed by a board certified advanced clinical practitioner to complete your personal care plan. Depending upon the condition, your plan could have included both over the counter or prescription medications. Please review your pharmacy choice. If there is a problem, you may call our nursing hot line at and have the prescription routed to another pharmacy. Your safety is important to [place]. If you have drug allergies check your prescription carefully.   You can use MyChart to ask questions about today's visit, request a non-urgent call back, or ask for a work or school excuse for 24 hours related to this e-Visit. If it has been greater than 24 hours you will need to follow up with your provider, or enter a new e-Visit to address those concerns. You will get an e-mail in the next two days asking about your experience.  I hope that your e-visit has been valuable and will speed your recovery. Thank you for using e-visits.   5-10 minutes spent reviewing and documenting in chart.

## 2018-12-05 ENCOUNTER — Ambulatory Visit (INDEPENDENT_AMBULATORY_CARE_PROVIDER_SITE_OTHER)
Admission: RE | Admit: 2018-12-05 | Discharge: 2018-12-05 | Disposition: A | Discharge status: Home / Self Care | Payer: Self-pay | Source: Ambulatory Visit

## 2018-12-05 ENCOUNTER — Other Ambulatory Visit: Payer: Self-pay

## 2018-12-05 DIAGNOSIS — Z20822 Contact with and (suspected) exposure to covid-19: Secondary | ICD-10-CM

## 2018-12-05 DIAGNOSIS — J0100 Acute maxillary sinusitis, unspecified: Secondary | ICD-10-CM

## 2018-12-05 MED ORDER — FLUCONAZOLE 150 MG PO TABS: 150.0000 mg | ORAL_TABLET | Freq: Every day | ORAL | 0 refills | Status: DC

## 2018-12-05 MED ORDER — CETIRIZINE HCL 10 MG PO TABS: 10.0000 mg | ORAL_TABLET | Freq: Every day | ORAL | 0 refills | Status: DC

## 2018-12-05 MED ORDER — AMOXICILLIN-POT CLAVULANATE 875-125 MG PO TABS: 1.0000 | ORAL_TABLET | Freq: Two times a day (BID) | ORAL | 0 refills | Status: DC

## 2018-12-05 MED ORDER — FLUTICASONE PROPIONATE 50 MCG/ACT NA SUSP: 1.0000 | Freq: Every day | NASAL | 2 refills | Status: DC

## 2018-12-05 NOTE — Discharge Instructions (Signed)
We are treating you for a sinus infection.  Take the medication as prescribed. Work note sent to your MyChart Go by the tent at [facility] and get tested for Covid. Follow up as needed for continued or worsening symptoms

## 2018-12-05 NOTE — ED Provider Notes (Signed)
Virtual Visit via Video Note:  Kelsey [name]  initiated request for Telemedicine visit with [facility] team. I connected with Kelsey [name]  on 12/05/2018 at 10:47 AM  for a synchronized telemedicine visit using a video enabled HIPPA compliant telemedicine application. I verified that I am speaking with Kelsey [name]  using two identifiers. Orvan July, NP  was physically located in a [facility] site and Kelsey [name] was located at a different location.   The limitations of evaluation and management by telemedicine as well as the availability of in-person appointments were discussed. Patient was informed that she  may incur a bill ( including co-pay) for this virtual visit encounter. Kelsey [name]  expressed understanding and gave verbal consent to proceed with virtual visit.     History of Present Illness:Kelsey [name]  is a 33 y.o. female presents with approx 7 days of worsening sinus pressure, nasal congestion, low grade fever, sinus headache and nasal mucous. No cough, chest congestion, ear pain or sore throat. She has been using ibuprofen, tylenol, sudafed with some relief.   Past Medical History:  Diagnosis Date  . Anxiety   . Depression   . Headache(784.0)    occasional migraines  . Heart murmur    VSD - states is size of a pin hole; no known problems; only sees cardiology every 2-3 years  . Hemorrhoids 05/2016  . History of asthma    as a child    Allergies  Allergen Reactions  . Aspirin Nausea And Vomiting and Other (See Comments)    HEADACHES        Observations/Objective:VITALS: Per patient if applicable, see vitals. GENERAL: Alert, appears well and in no acute distress. HEENT: Atraumatic, conjunctiva clear, no obvious abnormalities on inspection of external nose and ears. NECK: Normal movements of the head and neck. CARDIOPULMONARY: No increased WOB. Speaking in clear sentences. I:E ratio WNL.  MS: Moves all visible  extremities without noticeable abnormality. PSYCH: Pleasant and cooperative, well-groomed. Speech normal rate and rhythm. Affect is appropriate. Insight and judgement are appropriate. Attention is focused, linear, and appropriate.  NEURO: CN grossly intact. Oriented as arrived to appointment on time with no prompting. Moves both UE equally.  SKIN: No obvious lesions, wounds, erythema, or cyanosis noted on face or hands.     Assessment and Plan: Sinusitis-over 7 days of symptoms.  We will go ahead and treat with antibiotics.  Augmentin to treat.  Also recommended Flonase and Zyrtec.   Follow Up Instructions: Follow up as needed for continued or worsening symptoms     I discussed the assessment and treatment plan with the patient. The patient was provided an opportunity to ask questions and all were answered. The patient agreed with the plan and demonstrated an understanding of the instructions.   The patient was advised to call back or seek an in-person evaluation if the symptoms worsen or if the condition fails to improve as anticipated.    Orvan July, NP  12/05/2018 10:47 AM         Orvan July, NP 12/07/18 1145

## 2018-12-06 LAB — NOVEL CORONAVIRUS, NAA: SARS-CoV-2, NAA: NOT DETECTED

## 2018-12-08 ENCOUNTER — Other Ambulatory Visit: Payer: Self-pay

## 2018-12-08 ENCOUNTER — Telehealth (INDEPENDENT_AMBULATORY_CARE_PROVIDER_SITE_OTHER): Payer: Self-pay | Admitting: Family Medicine

## 2018-12-08 NOTE — Progress Notes (Signed)
Called patient 3 times. No answer. VM was full.

## 2019-01-28 ENCOUNTER — Ambulatory Visit: Payer: HRSA Program | Attending: Internal Medicine

## 2019-01-28 ENCOUNTER — Other Ambulatory Visit: Payer: Self-pay

## 2019-01-28 DIAGNOSIS — Z20822 Contact with and (suspected) exposure to covid-19: Secondary | ICD-10-CM

## 2019-01-28 DIAGNOSIS — Z20828 Contact with and (suspected) exposure to other viral communicable diseases: Secondary | ICD-10-CM | POA: Insufficient documentation

## 2019-01-29 ENCOUNTER — Other Ambulatory Visit: Payer: Self-pay

## 2019-01-30 ENCOUNTER — Telehealth: Payer: Self-pay | Admitting: Nurse Practitioner

## 2019-01-30 DIAGNOSIS — R6889 Other general symptoms and signs: Secondary | ICD-10-CM

## 2019-01-30 LAB — NOVEL CORONAVIRUS, NAA: SARS-CoV-2, NAA: NOT DETECTED

## 2019-01-30 MED ORDER — OSELTAMIVIR PHOSPHATE 75 MG PO CAPS: 75.0000 mg | ORAL_CAPSULE | Freq: Two times a day (BID) | ORAL | 0 refills | Status: DC

## 2019-01-30 NOTE — Progress Notes (Signed)
Orders moved to this encounter.  

## 2019-01-30 NOTE — Progress Notes (Signed)
Order(s) created erroneously. Erroneous order ID: [id number]  Order moved by: Brigitte Pulse  Order move date/time: 01/30/2019 1:45 PM  Source Patient: [id number]  Source Contact: 01/28/2019  Destination Patient: [id number]  Destination Contact: 01/28/2019

## 2019-01-30 NOTE — Progress Notes (Signed)

## 2019-01-30 NOTE — Progress Notes (Signed)
Order(s) created erroneously. Erroneous order ID: [id number]  Order moved by: Talen Poser M  Order move date/time: 01/30/2019 1:45 PM  Source Patient: [id number]  Source Contact: 01/28/2019  Destination Patient: [id number]  Destination Contact: 01/28/2019 

## 2019-02-18 ENCOUNTER — Ambulatory Visit: Payer: Self-pay | Attending: Internal Medicine

## 2019-02-18 DIAGNOSIS — Z20822 Contact with and (suspected) exposure to covid-19: Secondary | ICD-10-CM

## 2019-02-18 DIAGNOSIS — U071 COVID-19: Secondary | ICD-10-CM | POA: Insufficient documentation

## 2019-02-19 LAB — NOVEL CORONAVIRUS, NAA: SARS-CoV-2, NAA: DETECTED — AB

## 2019-03-11 ENCOUNTER — Encounter: Payer: Self-pay | Admitting: Family Medicine

## 2019-03-11 DIAGNOSIS — F329 Major depressive disorder, single episode, unspecified: Secondary | ICD-10-CM

## 2019-03-11 DIAGNOSIS — F32A Depression, unspecified: Secondary | ICD-10-CM

## 2019-03-11 MED ORDER — VENLAFAXINE HCL ER 75 MG PO CP24: 75.0000 mg | ORAL_CAPSULE | Freq: Every day | ORAL | 3 refills | Status: DC

## 2019-03-24 ENCOUNTER — Telehealth: Payer: Self-pay | Admitting: *Deleted

## 2019-03-24 NOTE — Telephone Encounter (Signed)
LVM to call office to go over screening questions prior to visit.Kelsey [name], CMA  

## 2019-03-25 ENCOUNTER — Ambulatory Visit: Payer: Self-pay | Admitting: Family Medicine

## 2019-03-30 ENCOUNTER — Other Ambulatory Visit: Payer: Self-pay

## 2019-03-30 ENCOUNTER — Encounter: Payer: Self-pay | Admitting: Family Medicine

## 2019-03-30 ENCOUNTER — Ambulatory Visit (INDEPENDENT_AMBULATORY_CARE_PROVIDER_SITE_OTHER): Payer: Self-pay | Admitting: Family Medicine

## 2019-03-30 DIAGNOSIS — Z308 Encounter for other contraceptive management: Secondary | ICD-10-CM

## 2019-03-30 DIAGNOSIS — F3341 Major depressive disorder, recurrent, in partial remission: Secondary | ICD-10-CM

## 2019-03-30 NOTE — Assessment & Plan Note (Signed)
Continue antidepressant.  List of counselors given.

## 2019-03-30 NOTE — Patient Instructions (Signed)
I will have my nurse work with you to schedule the nexplanon insertion removal.  She will block two slots - the removal may be tricky. Please plan to get your tubes tied when you have insurance.  It is the safest form of contraception. I am sorry about your daughter.  I hope she is doing OK emotionally. I will give you a list of counselors.  I think you are very smart to seek counseling.   We will talk more as I am doing the nexplanon.

## 2019-03-30 NOTE — Assessment & Plan Note (Signed)
Will go ahead with Nexplanon removal and insertion.
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
## 2019-04-09 ENCOUNTER — Ambulatory Visit: Payer: Self-pay | Admitting: Family Medicine

## 2019-04-09 ENCOUNTER — Telehealth: Payer: Self-pay | Admitting: Family Medicine

## 2019-04-09 ENCOUNTER — Ambulatory Visit (INDEPENDENT_AMBULATORY_CARE_PROVIDER_SITE_OTHER): Payer: Self-pay | Admitting: Family Medicine

## 2019-04-09 ENCOUNTER — Encounter: Payer: Self-pay | Admitting: Family Medicine

## 2019-04-09 ENCOUNTER — Other Ambulatory Visit: Payer: Self-pay

## 2019-04-09 VITALS — BP 112/64 | HR 84 | Wt 154.8 lb

## 2019-04-09 DIAGNOSIS — Z308 Encounter for other contraceptive management: Secondary | ICD-10-CM

## 2019-04-09 NOTE — Telephone Encounter (Signed)
Ms. Utsey was scheduled for removal of current Nexplanon and insertion of new Nexplanon today, but the up front cost of these procedures (greater than $1000 out of pocket) was prohibitive.  Unfortunately, Ms. Diesel was not made aware of this cost prior to the appointment, so she was not charged for this appointment.  She was counseled to have this procedure done at the health department or [facility] since they would likely have much more affordable pricing.  She was also reassured that Nexplanon does not stop being effective immediately at the 3-year mark, so she does have some time to arrange for a new Nexplanon.

## 2019-07-22 ENCOUNTER — Encounter: Payer: Self-pay | Admitting: Family Medicine

## 2019-07-23 MED ORDER — NORETHINDRONE 0.35 MG PO TABS: 1.0000 | ORAL_TABLET | Freq: Every day | ORAL | 11 refills | Status: DC

## 2019-07-23 NOTE — Telephone Encounter (Signed)
Called patient.  She is a smoker who will turn 34 soon.  No estrogens.  Discussed and provided Rx for Micronor.  She states that she does not want further children.  She will get her tubes tied when she has insurance.  Endorsed BTL as safest and most effective long term birth control for patients who have completed their childbearing.

## 2019-08-11 ENCOUNTER — Encounter: Payer: Self-pay | Admitting: Family Medicine

## 2019-09-24 ENCOUNTER — Other Ambulatory Visit: Payer: Self-pay

## 2020-02-27 ENCOUNTER — Other Ambulatory Visit: Payer: Self-pay

## 2020-02-27 ENCOUNTER — Ambulatory Visit (INDEPENDENT_AMBULATORY_CARE_PROVIDER_SITE_OTHER): Payer: Self-pay

## 2020-02-27 ENCOUNTER — Encounter (HOSPITAL_COMMUNITY): Payer: Self-pay

## 2020-02-27 ENCOUNTER — Ambulatory Visit (HOSPITAL_COMMUNITY)
Admission: EM | Admit: 2020-02-27 | Discharge: 2020-02-27 | Disposition: A | Discharge status: Home / Self Care | Payer: Self-pay | Attending: Student | Admitting: Student

## 2020-02-27 DIAGNOSIS — M79644 Pain in right finger(s): Secondary | ICD-10-CM

## 2020-02-27 DIAGNOSIS — S63641A Sprain of metacarpophalangeal joint of right thumb, initial encounter: Secondary | ICD-10-CM

## 2020-02-27 NOTE — ED Provider Notes (Signed)
[facility]    CSN: [id number] Arrival date & time: 02/27/20  1309      History   Chief Complaint Chief Complaint  Patient presents with  . Finger Injury    Occurred last night    HPI Kelsey [name] is a 35 y.o. female presenting for finger injury. States she was playing with her dog yesterday and injured right thumb. They were roughhousing and the dog lept forward into her right hand, pushing the thumb backwards. Since then, the thumb has been swollen and painful with movement and if it bumps against anything. She is right handed.  Denies sensation changes Able to move the thumb but with pain  Dog is owned by this pt and is up to date on shots.  Tdap last administered 2014. Pt denies laceration, bleeding, etc  Denies other injury, denies falls     HPI  Past Medical History:  Diagnosis Date  . Anxiety   . Depression   . Headache(784.0)    occasional migraines  . Heart murmur    VSD - states is size of a pin hole; no known problems; only sees cardiology every 2-3 years  . Hemorrhoids 05/2016  . History of asthma    as a child    Patient Active Problem List   Diagnosis Date Noted  . Screening cholesterol level 03/29/2016  . Acquired deformity of thigh, left 03/29/2016  . Need for SBE (subacute bacterial endocarditis) prophylaxis 08/04/2015  . Potential exposure to STD 06/25/2014  . Implanon in place 02/20/2013  . Contraceptive management 08/20/2012  . Hemorrhoids 08/20/2012  . History of cesarean delivery 06/28/2012  . 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 SINUSITIS, CHRONIC 07/09/2008  . TOBACCO ABUSE 01/31/2007  . Depression 04/11/2006  . Attention deficit hyperactivity disorder (ADHD) 04/11/2006  . VENTRICULAR SEPTAL DEFECT 04/11/2006    Past Surgical History:  Procedure Laterality Date  . CESAREAN SECTION  08/26/2002  . CESAREAN SECTION N/A 07/01/2012   Procedure: Repeat CESAREAN SECTION;  Surgeon: Levi Aland, MD;  Location: [facility];   Service: Obstetrics;  Laterality: N/A;  . HEMORRHOID SURGERY N/A 05/23/2016   Procedure: HEMORRHOIDECTOMY;  Surgeon: Berna Bue, MD;  Location: [facility];  Service: General;  Laterality: N/A;  . I & D EXTREMITY Left 08/05/2015   Procedure: IRRIGATION AND DEBRIDEMENT LEFT THIGH ;  Surgeon: Myrene Galas, MD;  Location: [facility];  Service: Orthopedics;  Laterality: Left;  . TONSILLECTOMY AND ADENOIDECTOMY    . WISDOM TOOTH EXTRACTION      OB History    Gravida  2   Para  2   Term  2   Preterm  0   AB  0   Living  2     SAB  0   IAB  0   Ectopic  0   Multiple  0   Live Births  2            Home Medications    Prior to Admission medications   Medication Sig Start Date End Date Taking? Authorizing Provider  acetaminophen (TYLENOL) 500 MG tablet Take 500 mg by mouth at bedtime as needed for moderate pain.    Yes [provider]  venlafaxine XR (EFFEXOR-XR) 75 MG 24 hr capsule Take 1 capsule (75 mg total) by mouth daily with breakfast. 03/11/19  Yes Hensel, Santiago Bumpers, MD  cetirizine (ZYRTEC) 10 MG tablet Take 1 tablet (10 mg total) by mouth daily. 12/05/18 02/27/20  Dahlia Byes  A, NP  fluticasone (FLONASE) 50 MCG/ACT nasal spray Place 1 spray into both nostrils daily. 12/05/18 02/27/20  Dahlia Byes A, NP  norethindrone (MICRONOR) 0.35 MG tablet Take 1 tablet (0.35 mg total) by mouth daily. 07/23/19 02/27/20  Moses Manners, MD    Family History Family History  Problem Relation Age of Onset  . Hyperlipidemia Mother   . Hyperthyroidism Mother   . Hyperlipidemia Father   . COPD Father   . Hypertension Father   . Ovarian cancer Sister   . Cancer Maternal Grandmother   . Colon cancer Maternal Grandfather   . Prostate cancer Maternal Grandfather   . Bone cancer Maternal Grandfather   . Colon cancer Paternal Uncle   . Heart disease Paternal Uncle     Social History Social History   Tobacco Use  . Smoking status: Current Every Day Smoker     Packs/day: 1.00    Years: 10.00    Pack years: 10.00    Types: Cigarettes  . Smokeless tobacco: Never Used  Vaping Use  . Vaping Use: Never used  Substance Use Topics  . Alcohol use: Yes    Comment: socially  . Drug use: No     Allergies   Aspirin   Review of Systems Review of Systems  Musculoskeletal:       R thumb pain and swelling   All other systems reviewed and are negative.    Physical Exam Triage Vital Signs ED Triage Vitals  Enc Vitals Group     BP 02/27/20 1405 116/63     Pulse Rate 02/27/20 1400 90     Resp 02/27/20 1400 18     Temp 02/27/20 1405 97.9 F (36.6 C)     Temp Source 02/27/20 1400 Oral     SpO2 02/27/20 1400 100 %     Weight --      Height --      Head Circumference --      Peak Flow --      Pain Score 02/27/20 1401 5     Pain Loc --      Pain Edu? --      Excl. in GC? --    No data found.  Updated Vital Signs BP 116/63 (BP Location: Right Arm)   Pulse 90   Temp 97.9 F (36.6 C) (Oral)   Resp 18   LMP 02/13/2020   SpO2 100%   Visual Acuity Right Eye Distance:   Left Eye Distance:   Bilateral Distance:    Right Eye Near:   Left Eye Near:    Bilateral Near:     Physical Exam Vitals reviewed.  Constitutional:      Appearance: Normal appearance.  Musculoskeletal:     Right hand: Swelling, tenderness and bony tenderness present. No deformity or lacerations. Normal sensation. There is no disruption of two-point discrimination. Normal capillary refill. Normal pulse.     Left hand: Normal.     Comments: Significant swelling and tenderness at base of R thumb MCP. ROM intact with pain. Neurovascularly intact. No bony deformity palpated. No laceration or abrasion.  Neurological:     General: No focal deficit present.     Mental Status: She is alert and oriented to person, place, and time.  Psychiatric:        Mood and Affect: Mood normal.        Behavior: Behavior normal.        Thought Content: Thought content normal.  Judgment: Judgment normal.      UC Treatments / Results  Labs (all labs ordered are listed, but only abnormal results are displayed) Labs Reviewed - No data to display  EKG   Radiology DG Finger Thumb Right  Result Date: 02/27/2020 CLINICAL DATA:  Right thumb pain x 1 day after playing with her dog and thumb went backwards. Constant dull pain with severe swelling. Limited ROM. No previous fx or surgery. EXAM: RIGHT THUMB 2+V COMPARISON:  None. FINDINGS: There is no evidence of fracture or dislocation. There is no evidence of arthropathy or other focal bone abnormality. Soft tissues are unremarkable. IMPRESSION: Negative. Electronically Signed   By: Emmaline Kluver M.D.   On: 02/27/2020 15:02    Procedures Procedures (including critical care time)  Medications Ordered in UC Medications - No data to display  Initial Impression / Assessment and Plan / UC Course  I have reviewed the triage vital signs and the nursing notes.  Pertinent labs & imaging results that were available during my care of the patient were reviewed by me and considered in my medical decision making (see chart for details).     Dog is owned by this pt and is up to date on shots.  Tdap last administered 2014. No lacerations or puncture wounds today, so deferred administering additional tdap today. No abx necessary.   Xray L thumb  There is no evidence of fracture or dislocation. There is no evidence of arthropathy or other focal bone abnormality. Soft tissues are unremarkable.  Thumb spice splint applied today. F/u with ortho in 5-7 days.   Final Clinical Impressions(s) / UC Diagnoses   Final diagnoses:  Sprain of metacarpophalangeal (MCP) joint of right thumb, initial encounter     Discharge Instructions     -Keep your splint on at all times. You can remove this while bathing. -Follow-up with orthopedist (information below) if symptoms worsen/persist past 5-7 days    ED Prescriptions     None     PDMP not reviewed this encounter.   Rhys Martini, PA-C 02/27/20 [phone]

## 2020-02-27 NOTE — ED Triage Notes (Signed)
Patient was playing with her dog yesterday and injured her right thumb. Pt has significant swelling and pain. Pt states it is burning feeling as well. Pt is aox4 and ambulatory.

## 2020-02-27 NOTE — Discharge Instructions (Addendum)
-  Keep your splint on at all times. You can remove this while bathing. -Follow-up with orthopedist (information below) if symptoms worsen/persist past 5-7 days

## 2020-03-07 ENCOUNTER — Ambulatory Visit: Payer: Self-pay | Admitting: Family Medicine

## 2020-05-19 ENCOUNTER — Encounter: Payer: Self-pay | Admitting: Family Medicine

## 2020-05-19 DIAGNOSIS — F32A Depression, unspecified: Secondary | ICD-10-CM

## 2020-05-20 MED ORDER — VENLAFAXINE HCL ER 75 MG PO CP24: 75.0000 mg | ORAL_CAPSULE | Freq: Every day | ORAL | 3 refills | Status: DC

## 2020-10-19 NOTE — Progress Notes (Signed)
    SUBJECTIVE:   CHIEF COMPLAINT / HPI: for PAP smear and would like lesion removed  Small genital wart that has been there for years.  Requesting removal.     Gyn concerns/Preventative healthcare Last menstrual period: Patient's last menstrual period was 10/11/2020. Regular periods: irregular Heavy bleeding: no Sexually active: yes Contraception or hormonal therapy:None,Has Nexplanon that expired in 2021 Hx of STD: Patient does desire STD screening Dyspareunia: No Vaginal discharge: small amount of vaginal discharge Dysuria:No  Last pap smear: 2014 NILM   PERTINENT  PMH / PSH:  Expired Nexplanon Genital Wart  OBJECTIVE:   BP 110/60   Pulse 96   Ht 5\' 4"  (1.626 m)   Wt 168 lb 6 oz (76.4 kg)   LMP 10/11/2020   SpO2 99%   BMI 28.90 kg/m    General: Alert, no acute distress Pelvic Exam chaperoned by CMA Tashira        External: normal female genitalia,small fleshy genital wart noted on mons        Vagina: normal without lesions or masses        Cervix: normal without lesions or masses          ASSESSMENT/PLAN:   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 Procedure tolerated well Care instructions provided Follow up with PCP as needed  Cervical cancer screening UPT negative Pap smear: performed Samples for Wet prep, GC/Chlamydia obtained Flagyl 500 mg BID x 7 days for symptomatic BV HIV, RPR,Hep C today Follow up with PCP to discuss birth control options as Nexplanon has expired 2021    2022, MD [facility]

## 2020-10-20 ENCOUNTER — Other Ambulatory Visit: Payer: Self-pay

## 2020-10-20 ENCOUNTER — Other Ambulatory Visit (HOSPITAL_COMMUNITY)
Admission: RE | Admit: 2020-10-20 | Discharge: 2020-10-20 | Disposition: A | Discharge status: Home / Self Care | Payer: Self-pay | Source: Ambulatory Visit | Attending: Family Medicine | Admitting: Family Medicine

## 2020-10-20 ENCOUNTER — Ambulatory Visit (INDEPENDENT_AMBULATORY_CARE_PROVIDER_SITE_OTHER): Payer: Self-pay | Admitting: Family Medicine

## 2020-10-20 ENCOUNTER — Encounter: Payer: Self-pay | Admitting: Family Medicine

## 2020-10-20 VITALS — BP 110/60 | HR 96 | Ht 64.0 in | Wt 168.4 lb

## 2020-10-20 DIAGNOSIS — Z113 Encounter for screening for infections with a predominantly sexual mode of transmission: Secondary | ICD-10-CM

## 2020-10-20 DIAGNOSIS — Z124 Encounter for screening for malignant neoplasm of cervix: Secondary | ICD-10-CM | POA: Insufficient documentation

## 2020-10-20 DIAGNOSIS — F331 Major depressive disorder, recurrent, moderate: Secondary | ICD-10-CM

## 2020-10-20 DIAGNOSIS — A630 Anogenital (venereal) warts: Secondary | ICD-10-CM

## 2020-10-20 LAB — POCT WET PREP (WET MOUNT)
Clue Cells Wet Prep Whiff POC: POSITIVE
Trichomonas Wet Prep HPF POC: ABSENT

## 2020-10-20 LAB — POCT URINE PREGNANCY: Preg Test, Ur: NEGATIVE

## 2020-10-20 MED ORDER — METRONIDAZOLE 500 MG PO TABS: 500.0000 mg | ORAL_TABLET | Freq: Three times a day (TID) | ORAL | 0 refills | Status: AC

## 2020-10-20 MED ORDER — DULOXETINE HCL 30 MG PO CPEP: 30.0000 mg | ORAL_CAPSULE | Freq: Every day | ORAL | 3 refills | Status: DC

## 2020-10-20 NOTE — Patient Instructions (Signed)
Call me in 4 weeks to let me know how you are doing.  Sooner if worse.   You are on a middle dose of the cymbalta.  I hope that our little experiment is successful. Counseling is good.  I hope you find the time. You know to get in touch with me.

## 2020-10-20 NOTE — Patient Instructions (Signed)
Thank you for coming to see me today. It was a pleasure.   We will get some labs today.  If they are abnormal or we need to do something about them, I will call you.  If they are normal, I will send you a message on MyChart (if it is active) or a letter in the mail.  If you don't hear from [place] in 2 weeks, please call the office at the number below.   Please follow-up with PCP as needed  If you have any questions or concerns, please do not hesitate to call the office at [phone].  Best,   Dana Allan, MD    What to expect after cryotherapy: The site will become red after the procedure and may develop a blister.  This is normal.  Do not pick the the blister or scab off.    You can clean the area with mild soap and pat to dry.  Apply Vaseline to th area 1-2 times a day.  You do not need to cover with a Band-Aid or dressing.     To prevent recurrent bacterial vaginosis, I recommend starting a women's probiotic.  You can find these at [employer], [employer], the pharmacy, and [employer].  They are usually in pink bottles.  There are some examples below.  Taking these daily will not completely prevent bacterial vaginosis, but can greatly reduce the recurrence.

## 2020-10-21 ENCOUNTER — Encounter: Payer: Self-pay | Admitting: Family Medicine

## 2020-10-21 LAB — HIV ANTIBODY (ROUTINE TESTING W REFLEX): HIV Screen 4th Generation wRfx: NONREACTIVE

## 2020-10-21 LAB — HCV INTERPRETATION

## 2020-10-21 LAB — HCV AB W REFLEX TO QUANT PCR: HCV Ab: <0.1 s/co ratio (ref 0.0–0.9)

## 2020-10-21 NOTE — Progress Notes (Signed)
    SUBJECTIVE:   CHIEF COMPLAINT / HPI:   Recurrence of depression and anxiety.  Kelsey [name] has a recent worsening of her chronic depression stemming from childhood trauma.  She weaned herself off the effexor about one month ago because it no longer seemed to be working.  Life stressor is that her daughter recently left for college.  She always had a special bond with her daughter.  Symptoms include easy frustration and crying.  Concerningly, she has increased her alcohol use (2 drinks per night).  Father had severe drug and alcohol addiction so she knows this is a problem.  She is also aware that alcohol give very short term relief but worsens depression in the long run.  No SI or HI.  Would like to try cymbalta because a friend has had a very good response to it.  Did counseling for a while, then stopped (full time work and single parent made allotting time difficult)    OBJECTIVE:   BP 110/60   Pulse 96   Ht 5\' 4"  (1.626 m)   Wt 168 lb 9.6 oz (76.5 kg)   LMP 10/11/2020   SpO2 99%   BMI 28.94 kg/m   Affect, cognition and mood normal throughout the visit.  She is earnest in wanting help.  ASSESSMENT/PLAN:   Depression OK to start cymbalta.  She wants to cut back on alcohol, which I strongly encourage.  She believes she should get back into counseling.     10/13/2020, MD [facility]

## 2020-10-21 NOTE — Assessment & Plan Note (Signed)
OK to start cymbalta.  She wants to cut back on alcohol, which I strongly encourage.  She believes she should get back into counseling.

## 2020-10-23 ENCOUNTER — Encounter: Payer: Self-pay | Admitting: Family Medicine

## 2020-10-23 DIAGNOSIS — Z124 Encounter for screening for malignant neoplasm of cervix: Secondary | ICD-10-CM

## 2020-10-23 HISTORY — DX: Encounter for screening for malignant neoplasm of cervix: Z12.4

## 2020-10-23 NOTE — Assessment & Plan Note (Signed)
1 genital wart removed by cryotherapy today. Procedure tolerated well Care instructions provided Follow up with PCP as needed

## 2020-10-23 NOTE — Assessment & Plan Note (Addendum)
UPT negative Pap smear: performed Samples for Wet prep, GC/Chlamydia obtained Flagyl 500 mg BID x 7 days for symptomatic BV HIV, RPR,Hep C today Follow up with PCP to discuss birth control options as Nexplanon has expired 2021

## 2020-10-25 LAB — RPR W/REFLEX TO TREPSURE: RPR: NONREACTIVE

## 2020-10-25 LAB — T PALLIDUM ANTIBODY, EIA: T pallidum Antibody, EIA: NEGATIVE

## 2020-10-26 LAB — CYTOLOGY - PAP
Adequacy: ABSENT
Chlamydia: NEGATIVE
Comment: NEGATIVE
Comment: NEGATIVE
Comment: NORMAL
Diagnosis: NEGATIVE
High risk HPV: NEGATIVE
Neisseria Gonorrhea: NEGATIVE

## 2020-10-28 ENCOUNTER — Encounter: Payer: Self-pay | Admitting: Family Medicine

## 2020-12-28 ENCOUNTER — Telehealth: Payer: Self-pay | Admitting: Nurse Practitioner

## 2020-12-28 DIAGNOSIS — B351 Tinea unguium: Secondary | ICD-10-CM

## 2020-12-28 DIAGNOSIS — B354 Tinea corporis: Secondary | ICD-10-CM

## 2020-12-28 MED ORDER — ITRACONAZOLE 100 MG PO CAPS: 200.0000 mg | ORAL_CAPSULE | Freq: Every day | ORAL | 0 refills | Status: DC

## 2020-12-28 MED ORDER — CLOTRIMAZOLE 1 % EX CREA: 1.0000 "application " | TOPICAL_CREAM | Freq: Two times a day (BID) | CUTANEOUS | 0 refills | Status: DC

## 2020-12-28 NOTE — Progress Notes (Signed)
E-Visit for Athlete's Foot  We are sorry that you are not feeling well. Here is how we plan to help!  Based on what you shared with me it looks like you have Tinea corporsis , which is ring worm. Ring worm is a fungal infection of the skin.  I am recommending:Clotrimazole 1% cream or gel, apply to area twice per day   Prescription medications are only indicated for an extensive rash or if over the counter treatments have failed.  I am prescribing:Itraconazole 200 mg once per day for one week  Nail fungus is very difficult to get rid of. If the oral meds do not help you will need to see your PCP for  longer treatment because you will need labs done to make sure liver is ok for longer treatment.  HOME CARE:  Keep clean, dry, and cool. Avoid using swimming pools, public showers, or foot baths. Wear sandals when possible or air shoes out by alternating them every 2-3 days. Avoid wearing closed shoes and wearing socks made from fabric that doesn't dry easily (for example, nylon). Treat the infection with recommended medication  GET HELP RIGHT AWAY IF:  Symptoms that don't go away after treatment. Severe itching that persists. If your rash spreads or swells. If your rash begins to have drainage or smell. You develop a fever.  MAKE SURE YOU   Understand these instructions. Will watch your condition. Will get help right away if you are not doing well or get worse.   Thank you for choosing an e-visit.  Your e-visit answers were reviewed by a board certified advanced clinical practitioner to complete your personal care plan. Depending upon the condition, your plan could have included both over the counter or prescription medications.  Please review your pharmacy choice. Make sure the pharmacy is open so you can pick up prescription now. If there is a problem, you may contact your provider through [employer] and have the prescription routed to another pharmacy.  Your  safety is important to [place]. If you have drug allergies check your prescription carefully.   For the next 24 hours you can use MyChart to ask questions about today's visit, request a non-urgent call back, or ask for a work or school excuse.  You will get an email in the next two days asking about your experience. I hope that your e-visit has been valuable and will speed your recovery  References or for more information:  [employer][email or web address]?search=[email or web address]&source=search_result&selectedTitle=1~104&usage_type=default&display_rank=1  [email or web address]   5-10 minutes spent reviewing and documenting in chart.

## 2021-01-12 ENCOUNTER — Ambulatory Visit (HOSPITAL_COMMUNITY)
Admission: EM | Admit: 2021-01-12 | Discharge: 2021-01-12 | Disposition: A | Discharge status: Home / Self Care | Payer: No Payment, Other | Attending: Psychiatry | Admitting: Psychiatry

## 2021-01-12 DIAGNOSIS — F1020 Alcohol dependence, uncomplicated: Secondary | ICD-10-CM | POA: Insufficient documentation

## 2021-01-12 DIAGNOSIS — Z79899 Other long term (current) drug therapy: Secondary | ICD-10-CM | POA: Insufficient documentation

## 2021-01-12 DIAGNOSIS — F1994 Other psychoactive substance use, unspecified with psychoactive substance-induced mood disorder: Secondary | ICD-10-CM | POA: Insufficient documentation

## 2021-01-12 DIAGNOSIS — Z560 Unemployment, unspecified: Secondary | ICD-10-CM | POA: Insufficient documentation

## 2021-01-12 DIAGNOSIS — R45851 Suicidal ideations: Secondary | ICD-10-CM | POA: Insufficient documentation

## 2021-01-12 NOTE — ED Notes (Signed)
Pt discharged in no acute distress. Verbalized understanding of discharge instructions reviewed on AVS. Safety maintained. °

## 2021-01-12 NOTE — Discharge Instructions (Addendum)

## 2021-01-12 NOTE — ED Provider Notes (Signed)
Behavioral Health Urgent Care Medical Screening Exam  Patient Name: Kelsey [name] MRN: [id number] Date of Evaluation: 01/12/21 Diagnosis:  Final diagnoses:  Substance induced mood disorder (HCC)    History of Present illness: Kelsey [name] is a 35 y.o. female patient who presents to the [facility] voluntarily as a walk-in accompanied by her mother with a chief complaint of "worsening depression."   Patient seen and evaluated face-to-face by this provider, chart reviewed and case discussed with Dr. Lucianne Muss. On evaluation, patient is alert and oriented x4. Her thought process is logical and speech is clear and coherent. Her mood is dysphoric and affect is congruent. She is noted to be tearful throughout the assessment.  Patient reports that she has been feeling severely depressed recently. She states that she does not know how to fix it. She states that she has a lot going on, but backpacks it instead of dealing with it. She identifies life stressors as financial problems, relationship problems with her kids, 39 year old daughter recently moved out for college and their relationship is not great, she recently quit her job in February, and her car went down. She states that these are things that she should not know how to process but she is having a difficult time. She describes her symptoms as feel stress, anxious, binge eating for comfort and difficulty sleeping at night. She denies having thoughts of wanting to hurt herself or others. She reports suicidal ideations in September after starting Cymbalta. She denies hx of suicide attempts. She denies AVH. She does not appear to be responding to internal or external stimuli.   Patient's mother states that the patient has been self medicating with alcohol. Patient reports drinking a half a bottle of liquor almost everyday. She reports that her alcohol use has worsened over the last 3 to 6 months. She reports drinking  since she was 13 years ago. She reports that the longest period she has not consumed alcohol was for 1 month in September 2022. She denied exhibiting alcohol withdrawal symptoms at that time in September. She denies a past history of alcohol withdrawal symptoms or substance abuse treatment. She reports smoking marijuana everyday, on average one blunt since age 73. She reports that she resides with her 74-year-old son and boyfriend. She is currently unemployed. She states that she is prescribed Effexor 75 mg daily by her PCP at [facility]. She states that she is not compliant with taking the Effexor and has not taken it in 3 days.  She denies a past history of psychiatric inpatient treatment. She states that she attended therapy in 05-Jun-2013 after her father passed away. Patient does not have current therapist. Patient reports a family history of: Brother has schizophrenia, father was diagnosed with manic episodes, depression, and personality disorder, and mother has a history of depression.  Patient was provided with options for treatment that consist of: admission here at the [facility] facility based crisis for medication management and therapy, resources for outpatient psychiatry, therapy and substance abuse. Patient states that she is interested in resources for outpatient psychiatry, therapy, and substance abuse. Patient was provided with resources for outpatient psychiatry, therapy, and substance abuse treatment facilities.   Psychiatric Specialty Exam  Presentation  General Appearance:Appropriate for Environment  Eye Contact:Fair  Speech:Clear and Coherent  Speech Volume:Normal  Handedness:No data recorded  Mood and Affect  Mood:Depressed  Affect:Congruent   Thought Process  Thought Processes:Coherent; Goal Directed  Descriptions of Associations:Intact  Orientation:Full (Time, Place and Person)  Thought Content:Logical    Hallucinations:None  Ideas of  Reference:None  Suicidal Thoughts:No  Homicidal Thoughts:No   Sensorium  Memory:Immediate Fair; Recent Fair; Remote Fair  Judgment:Fair  Insight:Fair   Executive Functions  Concentration:Fair  Attention Span:Fair  Recall:Fair  Fund of Knowledge:Fair  Language:No data recorded  Psychomotor Activity  Psychomotor Activity:Normal   Assets  Assets:Communication Skills; Desire for Improvement; Housing; Intimacy; Leisure Time; Physical Health; Social Support; Talents/Skills   Sleep  Sleep:Poor   Physical Exam: Physical Exam Constitutional:      Appearance: Normal appearance.  HENT:     Head: Normocephalic.     Nose: Nose normal.  Eyes:     Conjunctiva/sclera: Conjunctivae normal.  Cardiovascular:     Rate and Rhythm: Normal rate.  Pulmonary:     Effort: Pulmonary effort is normal.  Musculoskeletal:        General: Normal range of motion.     Cervical back: Normal range of motion.  Neurological:     Mental Status: She is alert and oriented to person, place, and time.   Review of Systems  Constitutional: Negative.   HENT: Negative.    Eyes: Negative.   Respiratory: Negative.    Cardiovascular: Negative.   Gastrointestinal: Negative.   Genitourinary: Negative.   Musculoskeletal: Negative.   Skin: Negative.   Neurological: Negative.   Endo/Heme/Allergies: Negative.   Psychiatric/Behavioral:  Positive for depression and substance abuse.   Blood pressure 137/74, pulse 87, temperature 98.2 F (36.8 C), temperature source Oral, resp. rate 16, height 5\' 6"  (1.676 m), weight 174 lb (78.9 kg), SpO2 100 %. Body mass index is 28.08 kg/m.  Musculoskeletal: Strength & Muscle Tone: within normal limits Gait & Station: normal Patient leans: N/A   [facility] MSE Discharge Disposition for Follow up and Recommendations: Based on my evaluation the patient does not appear to have an emergency medical condition and can be discharged with resources and follow up care in  outpatient services for Medication Management, Individual Therapy, and Group Therapy   , NP 01/12/2021, 2:06 PM

## 2021-01-12 NOTE — BH Assessment (Signed)
Kelsey [name] is urgent. Reports worsening depression for about 6 months. Passive SI, no plan or intent and good protective factors. Pt denies HI, AVH. Reports drinking on average a bottle of alcohol a night for the past three months, last drink 12 hours ago. Pt reports she started drinking around 1pm until she passed out (denies withdrawal symptoms). THC use daily. Stressors include lost of employment within the last year, conflict with her daughter who moved out house recently. Pt reports dx of PTSD and personality disorder. Pt reports difficulty sleeping, concentrating and anger. Pt taking Effexor prescribed by her PCP. Pt does not have outp services.
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
## 2021-01-16 MED ORDER — CLOTRIMAZOLE 1 % EX CREA: 1.0000 "application " | TOPICAL_CREAM | Freq: Two times a day (BID) | CUTANEOUS | 0 refills | Status: DC

## 2021-01-16 NOTE — Addendum Note (Signed)
Addended by: Margaretann Loveless on: 01/16/2021 06:20 PM   Modules accepted: Orders

## 2021-01-19 ENCOUNTER — Telehealth (HOSPITAL_COMMUNITY): Payer: Self-pay | Admitting: Family Medicine

## 2021-01-19 NOTE — BH Assessment (Signed)
Care Management [facility] Discharge Follow Up   Writer attempted to make contact with patient today and was unsuccessful.  Voicemail is full.  Per chart review, patient was provided with outpatient resources.

## 2021-05-18 ENCOUNTER — Telehealth: Payer: Medicaid Other | Admitting: Physician Assistant

## 2021-05-18 DIAGNOSIS — R3989 Other symptoms and signs involving the genitourinary system: Secondary | ICD-10-CM | POA: Diagnosis not present

## 2021-05-18 MED ORDER — CEPHALEXIN 500 MG PO CAPS: 500.0000 mg | ORAL_CAPSULE | Freq: Two times a day (BID) | ORAL | 0 refills | Status: AC

## 2021-05-18 NOTE — Progress Notes (Signed)

## 2021-05-23 NOTE — Progress Notes (Signed)
I have spent 5 minutes in review of e-visit questionnaire, review and updating patient chart, medical decision making and response to patient.   Daquann Merriott Cody Jaykwon Morones, PA-C    

## 2021-05-25 ENCOUNTER — Ambulatory Visit (INDEPENDENT_AMBULATORY_CARE_PROVIDER_SITE_OTHER): Payer: Self-pay | Admitting: Family Medicine

## 2021-05-25 VITALS — BP 118/66 | HR 85 | Wt 168.0 lb

## 2021-05-25 DIAGNOSIS — Z3046 Encounter for surveillance of implantable subdermal contraceptive: Secondary | ICD-10-CM

## 2021-05-25 DIAGNOSIS — Z308 Encounter for other contraceptive management: Secondary | ICD-10-CM

## 2021-05-25 LAB — POCT URINE PREGNANCY: Preg Test, Ur: NEGATIVE

## 2021-05-25 NOTE — Patient Instructions (Signed)
It was wonderful to meet you today. Thank you for allowing me to be a part of your care. Below is a short summary of what we discussed at your visit today: ? ?Nexplanon removal and replacement ?Today we attempted to remove your Nexplanon, however we could not find it on palpation or with ultrasound. ? ?We are sending you to get an x-ray of your arm to confirm it is still there.  Nexplanons will show up on x-ray.  Go to the below imaging center on [facility].  You may walk-in to have this done at any time Monday through Friday 8 AM to 5 PM.  You do not have to call ahead. ? ?[facility] ?[address] [place], [place] [address] ?[phone] ?Mon-Fri 8-5 ? ? ?  ? ?Surgical removal ?I have referred you to a surgery clinic for nexplanon removal since we cannot feel or see it on ultrasound in the clinic today.   ? ?Someone from their office should be calling you in 1 to 2 weeks to schedule an appointment.  If you do not hear from them, let us know. We may need to nudge along the referral.   ? ? ? ?If you have any questions or concerns, please do not hesitate to contact us via phone or MyChart message.  ? ?Fayette Pho, MD  ?

## 2021-05-25 NOTE — Progress Notes (Signed)
? ? ?  SUBJECTIVE:  ? ?CHIEF COMPLAINT / HPI:  ? ?Nexplanon removal and insertion ?Patient presents today with her boyfriend to have her Nexplanon removed and replaced.  Currently in left upper arm.  She reports doing well with this, only has intermittent spotting.  She is very pleased with her Nexplanon thus far.  She believes it expired (passed at 3-year mark) in 2021, but has not been able to come back in to have it replaced until now.  Urine pregnancy negative today. ? ?PERTINENT  PMH / PSH: History of cesarean delivery, tobacco use ? ?OBJECTIVE:  ? ?BP 118/66   Pulse 85   Wt 168 lb (76.2 kg)   LMP 05/11/2021   SpO2 98%   BMI 27.12 kg/m?   ? ?Physical Exam ?General: Awake, alert, oriented, no acute distress ?Respiratory: Unlabored respirations, speaking in full sentences, no respiratory distress ?Extremities: Moving all extremities spontaneously ?Neuro: Cranial nerves II through X grossly intact ?Psych: Normal insight and judgement  ? ?ASSESSMENT/PLAN:  ? ?Contraceptive management ?Patient desires Nexplanon removal and replacement. Upreg negative. Unfortunately, myself and Dr. Lum Babe are unable to palpate at bedside or locate Nexplanon with POCUS.  We will send for left humerus x-ray to confirm placement.  Discussed with GYN, they recommend referral to surgery.  Placed GEN surg referral.  Discussed with patient, she is amenable, all questions answered.  See AVS for more. ?  ? ? ?Fayette Pho, MD ?[facility]  ?

## 2021-05-25 NOTE — Assessment & Plan Note (Addendum)
Patient desires Nexplanon removal and replacement. Upreg negative. Unfortunately, myself and Dr. Lum Babe are unable to palpate at bedside or locate Nexplanon with POCUS.  We will send for left humerus x-ray to confirm placement.  Discussed with GYN, they recommend referral to surgery.  Placed GEN surg referral.  Discussed with patient, she is amenable, all questions answered.  See AVS for more. ?

## 2021-06-05 ENCOUNTER — Ambulatory Visit
Admission: RE | Admit: 2021-06-05 | Discharge: 2021-06-05 | Disposition: A | Discharge status: Home / Self Care | Payer: Medicaid Other | Source: Ambulatory Visit | Attending: Family Medicine | Admitting: Family Medicine

## 2021-06-05 DIAGNOSIS — M795 Residual foreign body in soft tissue: Secondary | ICD-10-CM | POA: Diagnosis not present

## 2021-06-05 DIAGNOSIS — S40852A Superficial foreign body of left upper arm, initial encounter: Secondary | ICD-10-CM | POA: Diagnosis not present

## 2021-06-06 ENCOUNTER — Encounter: Payer: Self-pay | Admitting: Family Medicine

## 2021-06-06 DIAGNOSIS — Z308 Encounter for other contraceptive management: Secondary | ICD-10-CM

## 2021-06-08 MED ORDER — NORGESTIMATE-ETH ESTRADIOL 0.25-35 MG-MCG PO TABS: 1.0000 | ORAL_TABLET | Freq: Every day | ORAL | 11 refills | Status: DC

## 2021-06-11 ENCOUNTER — Telehealth: Payer: Medicaid Other | Admitting: Family

## 2021-06-11 DIAGNOSIS — R399 Unspecified symptoms and signs involving the genitourinary system: Secondary | ICD-10-CM

## 2021-06-11 MED ORDER — CEPHALEXIN 500 MG PO CAPS: 500.0000 mg | ORAL_CAPSULE | Freq: Two times a day (BID) | ORAL | 0 refills | Status: DC

## 2021-06-11 NOTE — Progress Notes (Signed)

## 2021-06-14 ENCOUNTER — Other Ambulatory Visit: Payer: Self-pay | Admitting: Family Medicine

## 2021-06-14 DIAGNOSIS — F32A Depression, unspecified: Secondary | ICD-10-CM

## 2021-06-19 ENCOUNTER — Ambulatory Visit (INDEPENDENT_AMBULATORY_CARE_PROVIDER_SITE_OTHER): Payer: Medicaid Other | Admitting: Family Medicine

## 2021-06-19 ENCOUNTER — Encounter: Payer: Self-pay | Admitting: Family Medicine

## 2021-06-19 DIAGNOSIS — F331 Major depressive disorder, recurrent, moderate: Secondary | ICD-10-CM

## 2021-06-19 DIAGNOSIS — F17200 Nicotine dependence, unspecified, uncomplicated: Secondary | ICD-10-CM

## 2021-06-19 DIAGNOSIS — E281 Androgen excess: Secondary | ICD-10-CM

## 2021-06-19 DIAGNOSIS — Z308 Encounter for other contraceptive management: Secondary | ICD-10-CM | POA: Diagnosis not present

## 2021-06-19 MED ORDER — NORETHINDRONE 0.35 MG PO TABS: 1.0000 | ORAL_TABLET | Freq: Every day | ORAL | 11 refills | Status: DC

## 2021-06-19 MED ORDER — CHANTIX STARTING MONTH PAK 0.5 MG X 11 & 1 MG X 42 PO TBPK: ORAL_TABLET | ORAL | 0 refills | Status: DC

## 2021-06-19 MED ORDER — VENLAFAXINE HCL ER 150 MG PO CP24: 150.0000 mg | ORAL_CAPSULE | Freq: Every day | ORAL | 3 refills | Status: DC

## 2021-06-19 NOTE — Patient Instructions (Signed)
Call me or send me a my chart message in three weeks to let me know how things are going.  Remind me to send you more chantex, not the starter pack. ?I will call or send a my chart message about the PCOS labs.   ?I hope the higher dose of the effexor works well for you.   ?

## 2021-06-20 LAB — DHEA-SULFATE: DHEA-SO4: 228 ug/dL (ref 57.3–279.2)

## 2021-06-20 LAB — TESTOSTERONE: Testosterone: 24 ng/dL (ref 8–60)

## 2021-06-20 NOTE — Progress Notes (Signed)
? ? ?  SUBJECTIVE:  ? ?CHIEF COMPLAINT / HPI:  ? ?Multiple issues: ?Contraceptive management.  Was given Combined Oral Contraceptives until she could get her old nexplanon removed (2 years past due.)  Because she is age greater that 87 and a smoker, we agreed that progestin only contraception was a better option. ?Wants to quit smoking.  Best effort in the past was with chantix and asks to use again.  Motivated. ?Wants testing for PCOS.  Periods have been irregular but she has had nexplanon.  Main concern is acne and hirsuitism that seems to be worsening.   ?Depression/anxiety.  Worsened as her oldest daughter went off to college.  We tried cymbalta and she had a marked increase in her anxiety symptoms.  Went back to [employer].  On a low dose.  She perfers to minimize medications because of her family history of drug abuse. ? ? ? ?OBJECTIVE:  ? ?BP 132/74   Pulse 84   Wt 173 lb 9.6 oz (78.7 kg)   LMP 06/06/2021 (Approximate)   SpO2 100%   BMI 28.02 kg/m?   ?Neck supple without masses ?Lungs clear ?Cardiac RRR without m or g ? ?ASSESSMENT/PLAN:  ? ?Hyperandrogenemia syndrome, female, post pubertal ?Checked labs, which returned within the normal range.  Does not seem to have PCOS or other hyperandrogenic state. ? ?Contraceptive management ?Switched to progestin only OCP. ? ?Depression ?After discussion increased effexor dose from 75 to 150 per day. ? ?TOBACCO ABUSE ?Chantix Rx sent. ?  ? ? ?Moses Manners, MD ?[facility]  ?

## 2021-06-20 NOTE — Assessment & Plan Note (Signed)
Checked labs, which returned within the normal range.  Does not seem to have PCOS or other hyperandrogenic state. ?

## 2021-06-20 NOTE — Assessment & Plan Note (Signed)
Chantix Rx sent. ?

## 2021-06-20 NOTE — Assessment & Plan Note (Signed)
After discussion increased effexor dose from 75 to 150 per day. ?

## 2021-06-20 NOTE — Assessment & Plan Note (Signed)
Switched to progestin only OCP. ?

## 2021-06-29 DIAGNOSIS — Z3046 Encounter for surveillance of implantable subdermal contraceptive: Secondary | ICD-10-CM | POA: Diagnosis not present

## 2021-07-18 ENCOUNTER — Encounter: Payer: Self-pay | Admitting: *Deleted

## 2021-09-02 ENCOUNTER — Telehealth: Payer: Medicaid Other | Admitting: Nurse Practitioner

## 2021-09-02 DIAGNOSIS — K029 Dental caries, unspecified: Secondary | ICD-10-CM | POA: Diagnosis not present

## 2021-09-02 MED ORDER — NAPROXEN 500 MG PO TABS: 500.0000 mg | ORAL_TABLET | Freq: Two times a day (BID) | ORAL | 1 refills | Status: DC

## 2021-09-02 MED ORDER — AMOXICILLIN 500 MG PO CAPS: 500.0000 mg | ORAL_CAPSULE | Freq: Three times a day (TID) | ORAL | 0 refills | Status: AC

## 2021-09-02 NOTE — Addendum Note (Signed)
Addended by: Bennie Pierini on: 09/02/2021 02:49 PM   Modules accepted: Orders

## 2021-09-02 NOTE — Progress Notes (Signed)

## 2021-09-29 ENCOUNTER — Ambulatory Visit: Payer: Medicaid Other | Admitting: Student

## 2021-09-29 NOTE — Progress Notes (Deleted)
  SUBJECTIVE:   CHIEF COMPLAINT / HPI:   STI check - recently became sexually active with a new partner, wants to be checked for STIs. Previously tested for ***.  - preferred gender of partner: *** - Medications tried: *** - Sexually active with *** *** partner(s) - Last sexual encounter: *** - Contraception: *** Symptoms include: {STISXs:28021}   PERTINENT  PMH / PSH: ***  Past Medical History:  Diagnosis Date   Anxiety    Cervical cancer screening 10/23/2020   Depression    Headache(784.0)    occasional migraines   Heart murmur    VSD - states is size of a pin hole; no known problems; only sees cardiology every 2-3 years   Hemorrhoids 05/2016   History of asthma    as a child    OBJECTIVE:  There were no vitals taken for this visit.  General: NAD, pleasant, able to participate in exam Cardiac: RRR, no murmurs auscultated Respiratory: CTAB, normal WOB Abdomen: soft, non-tender, non-distended, normoactive bowel sounds Extremities: warm and well perfused, no edema or cyanosis Skin: warm and dry, no rashes noted Neuro: alert, no obvious focal deficits, speech normal Psych: Normal affect and mood  ASSESSMENT/PLAN:  No problem-specific Assessment & Plan notes found for this encounter.   No orders of the defined types were placed in this encounter.  No orders of the defined types were placed in this encounter.  No follow-ups on file. Alfredo Martinez, MD 09/29/2021, 8:38 AM   Reviewed labs and allergies, offered condoms, will check {STIplan:28022} and will call patient with results. Discussed safe sex practices. Will schedule pap if indicated ***. Patient's questions answered to their satisfaction.  PGY-2, [facility] {    This will disappear when note is signed, click to select method of visit    :1}

## 2021-10-13 ENCOUNTER — Telehealth: Payer: Medicaid Other | Admitting: Physician Assistant

## 2021-10-13 DIAGNOSIS — K047 Periapical abscess without sinus: Secondary | ICD-10-CM

## 2021-10-13 NOTE — Progress Notes (Signed)
Because of recent treatment for dental infection/pain in the last month, you will need to be evaluated in person for full examination and to receive proper treatment.   NOTE: There will be NO CHARGE for this eVisit   If you are having a true medical emergency please call 911.      For an urgent face to face visit, [facility] has seven urgent care centers for your convenience:     [facility] at [facility] Directions [phone] [address] [place], [place] [address]    [facility] Health Urgent Care Center [facility]) Get Driving Directions [phone] [address] [place], [place] [address]  [facility] Center [facility]) Get Driving Directions [phone] [address] [place],  [place]  [address]  [facility] at [employer] Directions  [phone] [address] [place],  [place] [address]   [facility] Health Urgent Care at [facility] Get Driving Directions [phone] [address] [place], [place] [address]   [facility] Health Urgent Care at [facility] Get Driving Directions  [phone] [address] [place], [place] [address]   [facility] Health Urgent Care at [facility] Directions [phone] [address], [address] [place], [place] [address]  Your MyChart E-visit questionnaire answers were reviewed by a board certified advanced clinical practitioner to complete your personal care plan based on your specific symptoms.  Thank you for using e-Visits.

## 2021-10-16 ENCOUNTER — Telehealth: Payer: Medicaid Other | Admitting: Physician Assistant

## 2021-10-16 ENCOUNTER — Encounter: Payer: Self-pay | Admitting: Family Medicine

## 2021-10-16 DIAGNOSIS — K047 Periapical abscess without sinus: Secondary | ICD-10-CM | POA: Diagnosis not present

## 2021-10-16 MED ORDER — IBUPROFEN 600 MG PO TABS: 600.0000 mg | ORAL_TABLET | Freq: Three times a day (TID) | ORAL | 0 refills | Status: DC | PRN

## 2021-10-16 MED ORDER — PENICILLIN V POTASSIUM 500 MG PO TABS: 500.0000 mg | ORAL_TABLET | Freq: Three times a day (TID) | ORAL | 0 refills | Status: AC

## 2021-10-16 NOTE — Progress Notes (Signed)

## 2022-03-20 ENCOUNTER — Encounter: Payer: Self-pay | Admitting: Family Medicine

## 2022-03-31 ENCOUNTER — Telehealth: Payer: Medicaid Other | Admitting: Family Medicine

## 2022-03-31 DIAGNOSIS — R3989 Other symptoms and signs involving the genitourinary system: Secondary | ICD-10-CM | POA: Diagnosis not present

## 2022-03-31 MED ORDER — CEPHALEXIN 500 MG PO CAPS: 500.0000 mg | ORAL_CAPSULE | Freq: Two times a day (BID) | ORAL | 0 refills | Status: AC

## 2022-03-31 NOTE — Progress Notes (Signed)

## 2022-06-28 ENCOUNTER — Other Ambulatory Visit: Payer: Self-pay

## 2022-06-28 DIAGNOSIS — F331 Major depressive disorder, recurrent, moderate: Secondary | ICD-10-CM

## 2022-06-28 MED ORDER — VENLAFAXINE HCL ER 150 MG PO CP24: 150.0000 mg | ORAL_CAPSULE | Freq: Every day | ORAL | Status: DC

## 2022-08-08 ENCOUNTER — Telehealth: Payer: Medicaid Other | Admitting: Nurse Practitioner

## 2022-08-08 DIAGNOSIS — B354 Tinea corporis: Secondary | ICD-10-CM | POA: Diagnosis not present

## 2022-08-08 MED ORDER — CLOTRIMAZOLE-BETAMETHASONE 1-0.05 % EX CREA: 1.0000 | TOPICAL_CREAM | Freq: Every day | CUTANEOUS | 0 refills | Status: DC

## 2022-08-08 MED ORDER — KETOCONAZOLE 2 % EX CREA: 1.0000 | TOPICAL_CREAM | Freq: Every day | CUTANEOUS | 0 refills | Status: DC

## 2022-08-08 NOTE — Addendum Note (Signed)
Addended by: Margaretann Loveless on: 08/08/2022 11:42 AM   Modules accepted: Orders

## 2022-08-08 NOTE — Progress Notes (Signed)
E-Visit for Athlete's Foot  We are sorry that you are not feeling well. Here is how we plan to help!  Based on what you shared with me it looks like you have tinea pedis, or "Athlete's Foot".  This type of rash can spread through shared towels, clothing, bedding, etc., as well as hard surfaces (particularly in moist areas) such as shower stalls, locker room floors, pool areas, etc. The symptoms of Athlete's Foot include red, swollen, peeling, itchy skin between the toes (especially between the pinky toe and the one next to it). The sole and heel of the foot may also be affected. In severe cases, the skin on the feet can blister.  Athlete's foot can usually be treated with over-the-counter topical antifungal products; but sometimes with chronic or extensive tinea pedis, prescription oral medications are needed.   I am recommending:Clotrimazole 1% cream or gel, apply to area twice per day   Prescription medications are only indicated for an extensive rash or if over the counter treatments have failed.   HOME CARE:  Keep feet clean, dry, and cool. Avoid using swimming pools, public showers, or foot baths. Wear sandals when possible or air shoes out by alternating them every 2-3 days. Avoid wearing closed shoes and wearing socks made from fabric that doesn't dry easily (for example, nylon). Treat the infection with recommended medication  GET HELP RIGHT AWAY IF:  Symptoms that don't go away after treatment. Severe itching that persists. If your rash spreads or swells. If your rash begins to have drainage or smell. You develop a fever.  MAKE SURE YOU   Understand these instructions. Will watch your condition. Will get help right away if you are not doing well or get worse.   Thank you for choosing an e-visit.  Your e-visit answers were reviewed by a board certified advanced clinical practitioner to complete your personal care plan. Depending upon the condition, your plan  could have included both over the counter or prescription medications.  Please review your pharmacy choice. Make sure the pharmacy is open so you can pick up prescription now. If there is a problem, you may contact your provider through [employer] and have the prescription routed to another pharmacy.  Your safety is important to [place]. If you have drug allergies check your prescription carefully.   For the next 24 hours you can use MyChart to ask questions about today's visit, request a non-urgent call back, or ask for a work or school excuse.  You will get an email in the next two days asking about your experience. I hope that your e-visit has been valuable and will speed your recovery  References or for more information:  [employer].com.au?search=[email or web address]%[email or web address]&source=search_result&selectedTitle=1~104&usage_type=default&display_rank=1  [email or web address]  Mary-Margaret Daphine Deutscher, FNP   5-10 minutes spent reviewing and documenting in chart.

## 2022-08-17 ENCOUNTER — Encounter: Payer: Self-pay | Admitting: Family Medicine

## 2022-08-17 ENCOUNTER — Ambulatory Visit: Payer: Medicaid Other | Admitting: Family Medicine

## 2022-08-17 ENCOUNTER — Other Ambulatory Visit (HOSPITAL_COMMUNITY)
Admission: RE | Admit: 2022-08-17 | Discharge: 2022-08-17 | Disposition: A | Discharge status: Home / Self Care | Payer: Medicaid Other | Source: Ambulatory Visit | Attending: Family Medicine | Admitting: Family Medicine

## 2022-08-17 VITALS — BP 109/68 | HR 82 | Ht 64.0 in | Wt 158.0 lb

## 2022-08-17 DIAGNOSIS — Q210 Ventricular septal defect: Secondary | ICD-10-CM | POA: Diagnosis not present

## 2022-08-17 DIAGNOSIS — Z308 Encounter for other contraceptive management: Secondary | ICD-10-CM

## 2022-08-17 DIAGNOSIS — F331 Major depressive disorder, recurrent, moderate: Secondary | ICD-10-CM

## 2022-08-17 DIAGNOSIS — F17200 Nicotine dependence, unspecified, uncomplicated: Secondary | ICD-10-CM

## 2022-08-17 DIAGNOSIS — Z975 Presence of (intrauterine) contraceptive device: Secondary | ICD-10-CM

## 2022-08-17 DIAGNOSIS — N939 Abnormal uterine and vaginal bleeding, unspecified: Secondary | ICD-10-CM

## 2022-08-17 DIAGNOSIS — Z3046 Encounter for surveillance of implantable subdermal contraceptive: Secondary | ICD-10-CM

## 2022-08-17 LAB — POCT WET PREP (WET MOUNT)
Clue Cells Wet Prep Whiff POC: NEGATIVE
Trichomonas Wet Prep HPF POC: ABSENT

## 2022-08-17 LAB — POCT URINE PREGNANCY: Preg Test, Ur: NEGATIVE

## 2022-08-17 MED ORDER — NICOTINE 14 MG/24HR TD PT24: 14.0000 mg | MEDICATED_PATCH | Freq: Every day | TRANSDERMAL | 1 refills | Status: DC

## 2022-08-17 NOTE — Progress Notes (Signed)
   SUBJECTIVE:   CHIEF COMPLAINT / HPI:   Depression - Medications: effexor - Taking: good compliance - Symptoms: increased anxiety since father passed away - Current stressors: recent death of father, moving.  Tobacco use - previously on chantix but worsened anxiety. Currenlty smokes 1ppd. Got down to 1 cigarette per day on chantix.   Vaginal spotting - Spotting all month. Lmp 07/23/22, shorter than normal cycle. No pain. No abnormal discharge. No dysuria. No dyspareunia. Does not wear condoms. Not consistent with OCP use. Has nexplanon in place but has been >5 years, lots of overlying scar tissue. Periods typically irregular.     OBJECTIVE:   BP 109/68   Pulse 82   Ht 5\' 4"  (1.626 m)   Wt 158 lb (71.7 kg)   LMP 07/23/2022   SpO2 100%   BMI 27.12 kg/m   Gen: well appearing, in NAD Card: RRR Lungs: CTAB GYN:  External genitalia within normal limits.  Vaginal mucosa pink, moist, normal rugae.  Nonfriable cervix without lesions. Small amount of brownish discharge noted on speculum exam.   No cervical motion tenderness.  Ext: WWP, no edema. Nexplanon barely palpable in L upper arm, overlying adhesions felt.    ASSESSMENT/PLAN:   TOBACCO ABUSE Will trial nicotine patch.  Implanon in place >5 years. Removal in office likely difficult due to lots of overlying scar tissue. Will refer to Gen Surg for removal, then RTC for replacement.   Depression Compliant with effexor and controls symptoms well however with multiple recent stressors. Will f/u in ~2 months for reassesssment.   Contraceptive management Counseled on consistent use. Good candidate for LARC, previously on nexplanon and did well, wants repeat. Obtain Upreg today. Will plan to replace once removed by gen surg given lots of adhesions.   Vaginal spotting Likely 2/2 inconsistent OCP use. Normal pelvic exam today. Upreg negative. Will obtain infectious testing to rule out alternative cause.     Kelsey Laroche,  DO

## 2022-08-17 NOTE — Assessment & Plan Note (Signed)
Likely 2/2 inconsistent OCP use. Normal pelvic exam today. Upreg negative. Will obtain infectious testing to rule out alternative cause.

## 2022-08-17 NOTE — Assessment & Plan Note (Signed)
Compliant with effexor and controls symptoms well however with multiple recent stressors. Will f/u in ~2 months for reassesssment.

## 2022-08-17 NOTE — Assessment & Plan Note (Signed)
Counseled on consistent use. Good candidate for LARC, previously on nexplanon and did well, wants repeat. Obtain Upreg today. Will plan to replace once removed by gen surg given lots of adhesions.

## 2022-08-17 NOTE — Patient Instructions (Signed)
It was great to see you!  Our plans for today:  - We are referrring you to Surgery for removal of your nexplanon. Continue to take your birth control until we place your new nexplanon. - try the nicotine patches to help quit smoking. - We are checking some labs today, we will release these results to your MyChart.  Call 1800-QUIT-NOW for help with stopping smoking.  Take care and seek immediate care sooner if you develop any concerns.   Dr. Linwood Dibbles

## 2022-08-17 NOTE — Assessment & Plan Note (Signed)
Will trial nicotine patch.

## 2022-08-17 NOTE — Assessment & Plan Note (Signed)
>  5 years. Removal in office likely difficult due to lots of overlying scar tissue. Will refer to Gen Surg for removal, then RTC for replacement.

## 2022-08-20 LAB — CERVICOVAGINAL ANCILLARY ONLY
Bacterial Vaginitis (gardnerella): NEGATIVE
Candida Glabrata: NEGATIVE
Candida Vaginitis: NEGATIVE
Chlamydia: NEGATIVE
Comment: NEGATIVE
Comment: NEGATIVE
Comment: NEGATIVE
Comment: NEGATIVE
Comment: NEGATIVE
Comment: NORMAL
Neisseria Gonorrhea: NEGATIVE
Trichomonas: NEGATIVE

## 2022-10-19 ENCOUNTER — Encounter: Payer: Self-pay | Admitting: Family Medicine

## 2022-10-19 MED ORDER — CEFADROXIL 500 MG PO CAPS: 500.0000 mg | ORAL_CAPSULE | Freq: Two times a day (BID) | ORAL | 0 refills | Status: AC

## 2022-11-09 ENCOUNTER — Ambulatory Visit: Payer: Medicaid Other | Admitting: Family Medicine

## 2022-11-22 DIAGNOSIS — Z975 Presence of (intrauterine) contraceptive device: Secondary | ICD-10-CM | POA: Diagnosis not present

## 2022-11-28 ENCOUNTER — Encounter (HOSPITAL_COMMUNITY): Payer: Self-pay

## 2022-11-28 NOTE — Progress Notes (Signed)
Surgery orders requested via Epic inbox. °

## 2022-11-28 NOTE — Progress Notes (Signed)
COVID Vaccine Completed:  Date of COVID positive in last 90 days:  PCP - Ellwood Dense, DO Cardiologist - Has not seen cardiology since 2009  Chest x-ray -  EKG -  Stress Test -  ECHO - 07-17-01 Epic Cardiac Cath -  Pacemaker/ICD device last checked: Spinal Cord Stimulator:  Bowel Prep -   Sleep Study -  CPAP -   Fasting Blood Sugar -  Checks Blood Sugar _____ times a day  Last dose of GLP1 agonist-  N/A GLP1 instructions:  N/A   Last dose of SGLT-2 inhibitors-  N/A SGLT-2 instructions: N/A   Blood Thinner Instructions:  Time Aspirin Instructions: Last Dose:  Activity level:  Can go up a flight of stairs and perform activities of daily living without stopping and without symptoms of chest pain or shortness of breath.  Able to exercise without symptoms  Unable to go up a flight of stairs without symptoms of     Anesthesia review: Ventricular septal defect, hx of murmur  Patient denies shortness of breath, fever, cough and chest pain at PAT appointment  Patient verbalized understanding of instructions that were given to them at the PAT appointment. Patient was also instructed that they will need to review over the PAT instructions again at home before surgery.

## 2022-11-28 NOTE — Patient Instructions (Signed)
SURGICAL WAITING ROOM VISITATION Patients having surgery or a procedure may have no more than 2 support people in the waiting area - these visitors may rotate.    Children under the age of 6 must have an adult with them who is not the patient.  If the patient needs to stay at the hospital during part of their recovery, the visitor guidelines for inpatient rooms apply. Pre-op nurse will coordinate an appropriate time for 1 support person to accompany patient in pre-op.  This support person may not rotate.    Please refer to the [facility] website for the visitor guidelines for Inpatients (after your surgery is over and you are in a regular room).       Your procedure is scheduled on: 12-10-22   Report to [facility] Main Entrance    Report to admitting at 11:15 AM   Call this number if you have problems the morning of surgery [phone]   Do not eat food :After Midnight.   After Midnight you may have the following liquids until 10:30 AM DAY OF SURGERY  Water Non-Citrus Juices (without pulp, NO RED-Apple, White grape, White cranberry) Black Coffee (NO MILK/CREAM OR CREAMERS, sugar ok)  Clear Tea (NO MILK/CREAM OR CREAMERS, sugar ok) regular and decaf                             Plain Jell-O (NO RED)                                           Fruit ices (not with fruit pulp, NO RED)                                     Popsicles (NO RED)                                                               Sports drinks like Gatorade (NO RED)                   The day of surgery:  Drink ONE (1) Pre-Surgery Clear Ensure by 10:30 AM the morning of surgery. Drink in one sitting. Do not sip.  This drink was given to you during your hospital  pre-op appointment visit. Nothing else to drink after completing the Pre-Surgery Clear Ensure.          If you have questions, please contact your surgeon's office.   FOLLOW  ANY ADDITIONAL PRE OP INSTRUCTIONS YOU RECEIVED FROM YOUR  SURGEON'S OFFICE!!!     Oral Hygiene is also important to reduce your risk of infection.                                    Remember - BRUSH YOUR TEETH THE MORNING OF SURGERY WITH YOUR REGULAR TOOTHPASTE   Do NOT smoke after Midnight   Take these medicines the morning of surgery with A SIP OF WATER:   Venlafaxine  Norethindrone  Stop  all vitamins and herbal supplements 7 days before surgery                              You may not have any metal on your body including hair pins, jewelry, and body piercing             Do not wear make-up, lotions, powders, perfumes or deodorant  Do not wear nail polish including gel and S&S, artificial/acrylic nails, or any other type of covering on natural nails including finger and toenails. If you have artificial nails, gel coating, etc. that needs to be removed by a nail salon please have this removed prior to surgery or surgery may need to be canceled/ delayed if the surgeon/ anesthesia feels like they are unable to be safely monitored.   Do not shave  48 hours prior to surgery.      Do not bring valuables to the hospital. [facility] IS NOT RESPONSIBLE   FOR VALUABLES.   Contacts, dentures or bridgework may not be worn into surgery.   DO NOT BRING YOUR HOME MEDICATIONS TO THE HOSPITAL. PHARMACY WILL DISPENSE MEDICATIONS LISTED ON YOUR MEDICATION LIST TO YOU DURING YOUR ADMISSION IN THE HOSPITAL!    Patients discharged on the day of surgery will not be allowed to drive home.  Someone NEEDS to stay with you for the first 24 hours after anesthesia.   Special Instructions: Bring a copy of your healthcare power of attorney and living will documents the day of surgery if you haven't scanned them before.              Please read over the following fact sheets you were given: IF YOU HAVE QUESTIONS ABOUT YOUR PRE-OP INSTRUCTIONS PLEASE CALL [phone] [facility]  If you received a COVID test during your pre-op visit  it is requested that you wear a mask  when out in public, stay away from anyone that may not be feeling well and notify your surgeon if you develop symptoms. If you test positive for Covid or have been in contact with anyone that has tested positive in the last 10 days please notify you surgeon.  [facility] - Preparing for Surgery Before surgery, you can play an important role.  Because skin is not sterile, your skin needs to be as free of germs as possible.  You can reduce the number of germs on your skin by washing with CHG (chlorahexidine gluconate) soap before surgery.  CHG is an antiseptic cleaner which kills germs and bonds with the skin to continue killing germs even after washing. Please DO NOT use if you have an allergy to CHG or antibacterial soaps.  If your skin becomes reddened/irritated stop using the CHG and inform your nurse when you arrive at Short Stay. Do not shave (including legs and underarms) for at least 48 hours prior to the first CHG shower.  You may shave your face/neck.  Please follow these instructions carefully:  1.  Shower with CHG Soap the night before surgery and the  morning of surgery.  2.  If you choose to wash your hair, wash your hair first as usual with your normal  shampoo.  3.  After you shampoo, rinse your hair and body thoroughly to remove the shampoo.                             4.  Use CHG as you would any other liquid soap.  You can apply chg directly to the skin and wash.  Gently with a scrungie or clean washcloth.  5.  Apply the CHG Soap to your body ONLY FROM THE NECK DOWN.   Do   not use on face/ open                           Wound or open sores. Avoid contact with eyes, ears mouth and   genitals (private parts).                       Wash face,  Genitals (private parts) with your normal soap.             6.  Wash thoroughly, paying special attention to the area where your    surgery  will be performed.  7.  Thoroughly rinse your body with warm water from the neck down.  8.  DO NOT  shower/wash with your normal soap after using and rinsing off the CHG Soap.                9.  Pat yourself dry with a clean towel.            10.  Wear clean pajamas.            11.  Place clean sheets on your bed the night of your first shower and do not  sleep with pets. Day of Surgery : Do not apply any lotions/deodorants the morning of surgery.  Please wear clean clothes to the hospital/surgery center.  FAILURE TO FOLLOW THESE INSTRUCTIONS MAY RESULT IN THE CANCELLATION OF YOUR SURGERY  PATIENT SIGNATURE_________________________________  NURSE SIGNATURE__________________________________  ________________________________________________________________________

## 2022-11-29 ENCOUNTER — Other Ambulatory Visit: Payer: Self-pay

## 2022-11-29 ENCOUNTER — Encounter (HOSPITAL_COMMUNITY): Payer: Self-pay

## 2022-11-29 ENCOUNTER — Encounter (HOSPITAL_COMMUNITY)
Admission: RE | Admit: 2022-11-29 | Discharge: 2022-11-29 | Disposition: A | Discharge status: Home / Self Care | Payer: Medicaid Other | Source: Ambulatory Visit | Attending: General Surgery | Admitting: General Surgery

## 2022-11-29 VITALS — BP 116/71 | HR 73 | Temp 98.5°F | Resp 14 | Ht 64.75 in | Wt 152.6 lb

## 2022-11-29 DIAGNOSIS — D649 Anemia, unspecified: Secondary | ICD-10-CM | POA: Insufficient documentation

## 2022-11-29 DIAGNOSIS — Z01818 Encounter for other preprocedural examination: Secondary | ICD-10-CM | POA: Diagnosis not present

## 2022-11-29 DIAGNOSIS — I2510 Atherosclerotic heart disease of native coronary artery without angina pectoris: Secondary | ICD-10-CM | POA: Diagnosis not present

## 2022-11-29 LAB — CBC
HCT: 42.5 % (ref 36.0–46.0)
Hemoglobin: 14.7 g/dL (ref 12.0–15.0)
MCH: 35.6 pg — ABNORMAL HIGH (ref 26.0–34.0)
MCHC: 34.6 g/dL (ref 30.0–36.0)
MCV: 102.9 fL — ABNORMAL HIGH (ref 80.0–100.0)
Platelets: 264 10*3/uL (ref 150–400)
RBC: 4.13 MIL/uL (ref 3.87–5.11)
RDW: 12.4 % (ref 11.5–15.5)
WBC: 8.3 10*3/uL (ref 4.0–10.5)
nRBC: 0 % (ref 0.0–0.2)

## 2022-11-29 LAB — BASIC METABOLIC PANEL
Anion gap: 10 (ref 5–15)
BUN: 8 mg/dL (ref 6–20)
CO2: 24 mmol/L (ref 22–32)
Calcium: 9.4 mg/dL (ref 8.9–10.3)
Chloride: 101 mmol/L (ref 98–111)
Creatinine, Ser: 0.71 mg/dL (ref 0.44–1.00)
GFR, Estimated: >60 mL/min (ref >60)
Glucose, Bld: 86 mg/dL (ref 70–99)
Potassium: 3.7 mmol/L (ref 3.5–5.1)
Sodium: 135 mmol/L (ref 135–145)

## 2022-11-30 ENCOUNTER — Encounter (HOSPITAL_COMMUNITY): Payer: Self-pay

## 2022-11-30 NOTE — Plan of Care (Signed)
[facility] Tonsillectomy/Adenoidectomy, Postoperative PEDS care plan entered in error.

## 2022-11-30 NOTE — Progress Notes (Signed)
Case: [id number] Date/Time: 12/10/22 1315   Procedure: EXCISION FOREIGN BODY LEFT UPPER ARM (Left)   Anesthesia type: Monitor Anesthesia Care   Pre-op diagnosis: NEXPLANON DEVICE   [facility] ROOM 01 / [facility]   Surgeons: Kinsinger, De Blanch, MD       DISCUSSION: Kelsey [name] is a 37 yo female who presents to PAT prior to surgery above. PMH of every day smoking, heart murmur, hx of VSD, depression.  Patient with hx of VSD. Last time she saw Cardiology was 2012. Per Dr. Tenny Craw: "Her last  echocardiogram was done in 2003 that showed normal LV function.  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  The velocity  through the defect was 4.5 meters per second.  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  Discussed with Dr. Jacquenette Shone. Ok to proceed without Cardiology f/u or updated echo.  VS: BP 116/71   Pulse 73   Temp 36.9 C (Oral)   Resp 14   Ht 5' 4.75" (1.645 m)   Wt 69.2 kg   LMP 11/18/2022 (Approximate)   SpO2 100%   BMI 25.59 kg/m   PROVIDERS: Caro Laroche, DO   LABS: Labs reviewed: Acceptable for surgery. (all labs ordered are listed, but only abnormal results are displayed)  Labs Reviewed  CBC - Abnormal; Notable for the following components:      Result Value   MCV 102.9 (*)    MCH 35.6 (*)    All other components within normal limits  BASIC METABOLIC PANEL     IMAGES:   EKG:   CV:  Past Medical History:  Diagnosis Date   Anemia    Anxiety    Cervical cancer screening 10/23/2020   Depression    Headache(784.0)    occasional migraines   Heart murmur    VSD - states is size of a pin hole; no known problems; only sees cardiology every 2-3 years   Hemorrhoids 05/2016   History of asthma    as a child   VSD (ventricular septal defect)     Past Surgical History:  Procedure Laterality Date   CESAREAN SECTION  08/26/2002   CESAREAN SECTION N/A 07/01/2012   Procedure: Repeat CESAREAN SECTION;  Surgeon:  Levi Aland, MD;  Location: [facility];  Service: Obstetrics;  Laterality: N/A;   HEMORRHOID SURGERY N/A 05/23/2016   Procedure: HEMORRHOIDECTOMY;  Surgeon: Berna Bue, MD;  Location: [facility];  Service: General;  Laterality: N/A;   I & D EXTREMITY Left 08/05/2015   Procedure: IRRIGATION AND DEBRIDEMENT LEFT THIGH ;  Surgeon: Myrene Galas, MD;  Location: [facility];  Service: Orthopedics;  Laterality: Left;   TONSILLECTOMY AND ADENOIDECTOMY     WISDOM TOOTH EXTRACTION      MEDICATIONS:  acetaminophen (TYLENOL) 500 MG tablet   etonogestrel (NEXPLANON) 68 MG IMPL implant   nicotine (NICODERM CQ - DOSED IN MG/24 HOURS) 14 mg/24hr patch   venlafaxine XR (EFFEXOR-XR) 150 MG 24 hr capsule   No current facility-administered medications for this encounter.   [name] [facility] Short Stay/Anesthesiology [facility] Phone [phone] 11/30/2022 4:00 PM

## 2022-12-03 NOTE — Anesthesia Preprocedure Evaluation (Addendum)
Anesthesia Evaluation  Patient identified by MRN, date of birth, ID band Patient awake    Reviewed: Allergy & Precautions, NPO status , Patient's Chart, lab work & pertinent test results  Airway Mallampati: I  TM Distance: >3 FB Neck ROM: Full    Dental  (+) Teeth Intact, Dental Advisory Given   Pulmonary Current Smoker and Patient abstained from smoking.   breath sounds clear to auscultation       Cardiovascular + Valvular Problems/Murmurs  Rhythm:Regular Rate:Normal     Neuro/Psych  Headaches PSYCHIATRIC DISORDERS Anxiety Depression       GI/Hepatic negative GI ROS, Neg liver ROS,,,  Endo/Other  negative endocrine ROS    Renal/GU negative Renal ROS  negative genitourinary   Musculoskeletal negative musculoskeletal ROS (+)    Abdominal   Peds  Hematology  (+) Blood dyscrasia, anemia   Anesthesia Other Findings   Reproductive/Obstetrics                             Anesthesia Physical Anesthesia Plan  ASA: 2  Anesthesia Plan: MAC   Post-op Pain Management: Tylenol PO (pre-op)* and Toradol IV (intra-op)*   Induction:   PONV Risk Score and Plan: 2 and Ondansetron, Propofol infusion and Midazolam  Airway Management Planned: Natural Airway and Nasal Cannula  Additional Equipment: None  Intra-op Plan:   Post-operative Plan:   Informed Consent: I have reviewed the patients History and Physical, chart, labs and discussed the procedure including the risks, benefits and alternatives for the proposed anesthesia with the patient or authorized representative who has indicated his/her understanding and acceptance.       Plan Discussed with: CRNA  Anesthesia Plan Comments: (See PAT note from 10/17 by Sherlie Ban PA-C )        Anesthesia Quick Evaluation

## 2022-12-07 ENCOUNTER — Ambulatory Visit: Payer: Self-pay | Admitting: General Surgery

## 2022-12-10 ENCOUNTER — Encounter (HOSPITAL_COMMUNITY): Payer: Self-pay | Admitting: General Surgery

## 2022-12-10 ENCOUNTER — Encounter (HOSPITAL_COMMUNITY)
Admission: RE | Disposition: A | Discharge status: Home / Self Care | Payer: Self-pay | Source: Ambulatory Visit | Attending: General Surgery

## 2022-12-10 ENCOUNTER — Ambulatory Visit (HOSPITAL_COMMUNITY): Payer: Medicaid Other | Admitting: Physician Assistant

## 2022-12-10 ENCOUNTER — Ambulatory Visit (HOSPITAL_COMMUNITY)
Admission: RE | Admit: 2022-12-10 | Discharge: 2022-12-10 | Disposition: A | Discharge status: Home / Self Care | Payer: Medicaid Other | Source: Ambulatory Visit | Attending: General Surgery | Admitting: General Surgery

## 2022-12-10 ENCOUNTER — Other Ambulatory Visit: Payer: Self-pay

## 2022-12-10 ENCOUNTER — Ambulatory Visit (HOSPITAL_BASED_OUTPATIENT_CLINIC_OR_DEPARTMENT_OTHER): Payer: Medicaid Other | Admitting: Anesthesiology

## 2022-12-10 DIAGNOSIS — Z3046 Encounter for surveillance of implantable subdermal contraceptive: Secondary | ICD-10-CM | POA: Diagnosis not present

## 2022-12-10 DIAGNOSIS — Z30432 Encounter for removal of intrauterine contraceptive device: Secondary | ICD-10-CM

## 2022-12-10 DIAGNOSIS — F17210 Nicotine dependence, cigarettes, uncomplicated: Secondary | ICD-10-CM | POA: Insufficient documentation

## 2022-12-10 DIAGNOSIS — S40852A Superficial foreign body of left upper arm, initial encounter: Secondary | ICD-10-CM | POA: Diagnosis not present

## 2022-12-10 DIAGNOSIS — J45909 Unspecified asthma, uncomplicated: Secondary | ICD-10-CM | POA: Insufficient documentation

## 2022-12-10 DIAGNOSIS — S50852A Superficial foreign body of left forearm, initial encounter: Secondary | ICD-10-CM | POA: Diagnosis not present

## 2022-12-10 DIAGNOSIS — Z4589 Encounter for adjustment and management of other implanted devices: Secondary | ICD-10-CM | POA: Diagnosis present

## 2022-12-10 HISTORY — PX: FOREIGN BODY REMOVAL: SHX962

## 2022-12-10 LAB — POCT PREGNANCY, URINE: Preg Test, Ur: NEGATIVE

## 2022-12-10 SURGERY — FOREIGN BODY REMOVAL ADULT
Anesthesia: Monitor Anesthesia Care | Laterality: Left

## 2022-12-10 MED ORDER — ACETAMINOPHEN 325 MG PO TABS: 325.0000 mg | ORAL_TABLET | ORAL | Status: DC | PRN

## 2022-12-10 MED ORDER — OXYCODONE HCL 5 MG PO TABS: 5.0000 mg | ORAL_TABLET | Freq: Once | ORAL | Status: DC | PRN

## 2022-12-10 MED ORDER — CEFAZOLIN SODIUM-DEXTROSE 2-4 GM/100ML-% IV SOLN
2.0000 g | INTRAVENOUS | Status: AC
  Administered 2022-12-10: 2 g via INTRAVENOUS
  Filled 2022-12-10: qty 100

## 2022-12-10 MED ORDER — PROPOFOL 500 MG/50ML IV EMUL
INTRAVENOUS | Status: DC | PRN
  Administered 2022-12-10: 130 ug/kg/min via INTRAVENOUS

## 2022-12-10 MED ORDER — MIDAZOLAM HCL 2 MG/2ML IJ SOLN
INTRAMUSCULAR | Status: AC
  Filled 2022-12-10: qty 2

## 2022-12-10 MED ORDER — MIDAZOLAM HCL 5 MG/5ML IJ SOLN
INTRAMUSCULAR | Status: DC | PRN
  Administered 2022-12-10: 2 mg via INTRAVENOUS

## 2022-12-10 MED ORDER — ONDANSETRON HCL 4 MG/2ML IJ SOLN
INTRAMUSCULAR | Status: DC | PRN
  Administered 2022-12-10: 4 mg via INTRAVENOUS

## 2022-12-10 MED ORDER — FENTANYL CITRATE (PF) 100 MCG/2ML IJ SOLN
INTRAMUSCULAR | Status: AC
  Filled 2022-12-10: qty 2

## 2022-12-10 MED ORDER — ACETAMINOPHEN 500 MG PO TABS
1000.0000 mg | ORAL_TABLET | ORAL | Status: AC
  Administered 2022-12-10: 1000 mg via ORAL
  Filled 2022-12-10: qty 2

## 2022-12-10 MED ORDER — LACTATED RINGERS IV SOLN: INTRAVENOUS | Status: DC

## 2022-12-10 MED ORDER — ORAL CARE MOUTH RINSE: 15.0000 mL | Freq: Once | OROMUCOSAL | Status: AC

## 2022-12-10 MED ORDER — LIDOCAINE 2% (20 MG/ML) 5 ML SYRINGE
INTRAMUSCULAR | Status: DC | PRN
  Administered 2022-12-10: 60 mg via INTRAVENOUS

## 2022-12-10 MED ORDER — ACETAMINOPHEN 160 MG/5ML PO SOLN: 325.0000 mg | ORAL | Status: DC | PRN

## 2022-12-10 MED ORDER — EPHEDRINE 5 MG/ML INJ
INTRAVENOUS | Status: AC
  Filled 2022-12-10: qty 5

## 2022-12-10 MED ORDER — DROPERIDOL 2.5 MG/ML IJ SOLN: 0.6250 mg | Freq: Once | INTRAMUSCULAR | Status: DC | PRN

## 2022-12-10 MED ORDER — BUPIVACAINE HCL (PF) 0.25 % IJ SOLN
INTRAMUSCULAR | Status: AC
  Filled 2022-12-10: qty 30

## 2022-12-10 MED ORDER — EPHEDRINE SULFATE-NACL 50-0.9 MG/10ML-% IV SOSY
PREFILLED_SYRINGE | INTRAVENOUS | Status: DC | PRN
  Administered 2022-12-10: 5 mg via INTRAVENOUS

## 2022-12-10 MED ORDER — OXYCODONE HCL 5 MG/5ML PO SOLN
5.0000 mg | Freq: Once | ORAL | Status: DC | PRN
Start: 2022-12-10 — End: 2022-12-10

## 2022-12-10 MED ORDER — ACETAMINOPHEN 10 MG/ML IV SOLN: 1000.0000 mg | Freq: Once | INTRAVENOUS | Status: DC | PRN

## 2022-12-10 MED ORDER — PROPOFOL 500 MG/50ML IV EMUL
INTRAVENOUS | Status: AC
  Filled 2022-12-10: qty 50

## 2022-12-10 MED ORDER — 0.9 % SODIUM CHLORIDE (POUR BTL) OPTIME
TOPICAL | Status: DC | PRN
  Administered 2022-12-10: 500 mL

## 2022-12-10 MED ORDER — FENTANYL CITRATE (PF) 100 MCG/2ML IJ SOLN
INTRAMUSCULAR | Status: DC | PRN
  Administered 2022-12-10: 25 ug via INTRAVENOUS
  Administered 2022-12-10: 50 ug via INTRAVENOUS

## 2022-12-10 MED ORDER — ONDANSETRON HCL 4 MG/2ML IJ SOLN
INTRAMUSCULAR | Status: AC
  Filled 2022-12-10: qty 2

## 2022-12-10 MED ORDER — IBUPROFEN 800 MG PO TABS: 800.0000 mg | ORAL_TABLET | Freq: Three times a day (TID) | ORAL | 0 refills | Status: AC | PRN

## 2022-12-10 MED ORDER — PROPOFOL 10 MG/ML IV BOLUS
INTRAVENOUS | Status: AC
  Filled 2022-12-10: qty 20

## 2022-12-10 MED ORDER — PROPOFOL 10 MG/ML IV BOLUS
INTRAVENOUS | Status: DC | PRN
Start: 2022-12-10 — End: 2022-12-10
  Administered 2022-12-10: 10 mg via INTRAVENOUS
  Administered 2022-12-10: 20 mg via INTRAVENOUS

## 2022-12-10 MED ORDER — CHLORHEXIDINE GLUCONATE CLOTH 2 % EX PADS: 6.0000 | MEDICATED_PAD | Freq: Once | CUTANEOUS | Status: DC

## 2022-12-10 MED ORDER — BUPIVACAINE HCL (PF) 0.25 % IJ SOLN
INTRAMUSCULAR | Status: DC | PRN
  Administered 2022-12-10: 17 mL

## 2022-12-10 MED ORDER — CHLORHEXIDINE GLUCONATE 0.12 % MT SOLN
15.0000 mL | Freq: Once | OROMUCOSAL | Status: AC
  Administered 2022-12-10: 15 mL via OROMUCOSAL

## 2022-12-10 MED ORDER — FENTANYL CITRATE PF 50 MCG/ML IJ SOSY: 25.0000 ug | PREFILLED_SYRINGE | INTRAMUSCULAR | Status: DC | PRN

## 2022-12-10 SURGICAL SUPPLY — 36 items
ADH SKN CLS APL DERMABOND .7 (GAUZE/BANDAGES/DRESSINGS) ×1
APL PRP STRL LF DISP 70% ISPRP (MISCELLANEOUS)
APL SKNCLS STERI-STRIP NONHPOA (GAUZE/BANDAGES/DRESSINGS)
BAG COUNTER SPONGE SURGICOUNT (BAG) IMPLANT
BAG SPNG CNTER NS LX DISP (BAG)
BENZOIN TINCTURE PRP APPL 2/3 (GAUZE/BANDAGES/DRESSINGS) IMPLANT
BLADE CLIPPER SURG (BLADE) IMPLANT
CHLORAPREP W/TINT 26 (MISCELLANEOUS) IMPLANT
COVER SURGICAL LIGHT HANDLE (MISCELLANEOUS) ×1 IMPLANT
DERMABOND ADVANCED .7 DNX12 (GAUZE/BANDAGES/DRESSINGS) IMPLANT
DRAPE EXTREMITY T 121X128X90 (DISPOSABLE) IMPLANT
DRAPE LAPAROTOMY T 98X78 PEDS (DRAPES) ×1 IMPLANT
DRAPE UTILITY XL STRL (DRAPES) ×1 IMPLANT
DRSG TEGADERM 4X4.75 (GAUZE/BANDAGES/DRESSINGS) IMPLANT
ELECT REM PT RETURN 15FT ADLT (MISCELLANEOUS) ×1 IMPLANT
GAUZE SPONGE 4X4 12PLY STRL (GAUZE/BANDAGES/DRESSINGS) IMPLANT
GLOVE BIOGEL PI IND STRL 7.5 (GLOVE) ×1 IMPLANT
GLOVE SURG SS PI 7.0 STRL IVOR (GLOVE) ×1 IMPLANT
GOWN STRL REUS W/ TWL LRG LVL3 (GOWN DISPOSABLE) ×2 IMPLANT
GOWN STRL REUS W/ TWL XL LVL3 (GOWN DISPOSABLE) IMPLANT
GOWN STRL REUS W/TWL LRG LVL3 (GOWN DISPOSABLE) ×2
GOWN STRL REUS W/TWL XL LVL3 (GOWN DISPOSABLE)
KIT BASIN OR (CUSTOM PROCEDURE TRAY) ×2 IMPLANT
KIT TURNOVER KIT A (KITS) IMPLANT
NDL HYPO 25X1 1.5 SAFETY (NEEDLE) ×1 IMPLANT
NEEDLE HYPO 25X1 1.5 SAFETY (NEEDLE) ×1
PACK BASIC VI WITH GOWN DISP (CUSTOM PROCEDURE TRAY) ×1 IMPLANT
PENCIL SMOKE EVACUATOR (MISCELLANEOUS) IMPLANT
STRIP CLOSURE SKIN 1/2X4 (GAUZE/BANDAGES/DRESSINGS) IMPLANT
SUT MNCRL AB 4-0 PS2 18 (SUTURE) ×1 IMPLANT
SUT VIC AB 2-0 SH 27 (SUTURE) ×1
SUT VIC AB 2-0 SH 27X BRD (SUTURE) ×1 IMPLANT
SUT VIC AB 3-0 SH 27 (SUTURE) ×1
SUT VIC AB 3-0 SH 27XBRD (SUTURE) ×1 IMPLANT
SYR CONTROL 10ML LL (SYRINGE) ×2 IMPLANT
TOWEL OR 17X26 10 PK STRL BLUE (TOWEL DISPOSABLE) ×2 IMPLANT

## 2022-12-10 NOTE — Op Note (Signed)
Preoperative diagnosis: foreign body left upper arm  Postoperative diagnosis: same   Procedure: removal of foreign body left upper arm  Surgeon: Feliciana Rossetti, M.D.  Asst: none  Anesthesia: MAC  Indications for procedure: Kelsey [name] is a 37 y.o. year old female with history of nexplanon device that has been unable to be removed. She presents for surgical removal.  Description of procedure: The patient was brought into the operative suite. Anesthesia was administered with Monitored Local Anesthesia with Sedation. WHO checklist was applied. The patient was then placed in supine position. The area was prepped and draped in the usual sterile fashion.  Next, ultrasound was used to identify the foreign body. It was located 1 cm deep to the skin in the medial left upper arm. Marcaine was injected over the foreign body. A small incision was made. Blunt dissection was used to identify the foreign body. Palpation and additional cautery and blunt dissection was used to identify the foreign body. The foreign body was isolated away from the surrounding tissue and removed. The subcutaneous tissue was closed with 3-0 vicryl. The skin was closed with interrupted 4-0 monocryl. Dermabond was put in place for dressing.  Findings: intact nexplanon device  Specimen: foreign body left upper arm  Implant: none   Blood loss: 10 ml  Local anesthesia:  17 ml marcaine   Complications: none  Feliciana Rossetti, M.D. General, Bariatric, & Minimally Invasive Surgery [facility]

## 2022-12-10 NOTE — Anesthesia Procedure Notes (Signed)
Procedure Name: MAC Date/Time: 12/10/2022 2:31 PM  Performed by: Elisabeth Cara, CRNAPre-anesthesia Checklist: Patient identified, Emergency Drugs available, Suction available, Patient being monitored and Timeout performed Patient Re-evaluated:Patient Re-evaluated prior to induction Oxygen Delivery Method: Simple face mask Placement Confirmation: positive ETCO2 Dental Injury: Teeth and Oropharynx as per pre-operative assessment

## 2022-12-10 NOTE — H&P (Signed)
Chief Complaint: Discuss Nexplanon Removal   History of Present Illness: Kelsey [name] is a 37 y.o. female who is seen today for nexplanon device.    Review of Systems: A complete review of systems was obtained from the patient. I have reviewed this information and discussed as appropriate with the patient. See HPI as well for other ROS.  Review of Systems  Constitutional: Negative.  HENT: Negative.  Eyes: Negative.  Respiratory: Negative.  Cardiovascular: Negative.  Gastrointestinal: Negative.  Genitourinary: Negative.  Musculoskeletal: Negative.  Skin: Negative.  Neurological: Negative.  Endo/Heme/Allergies: Negative.  Psychiatric/Behavioral: Negative.    Medical History: Past Medical History:  Diagnosis Date  Anxiety  Asthma, unspecified asthma severity, unspecified whether complicated, unspecified whether persistent ([facility]-HCC)  VSD (ventricular septal defect) ([facility]HS-HCC)  No repair done   Patient Active Problem List  Diagnosis  Ventricular septal defect in pregnancy, antepartum   Past Surgical History:  Procedure Laterality Date  TONSILLECTOMY 02/13/1999  CESAREAN SECTION 02/12/2002  OTHER SURGERY 02/12/2009  wisdom teeth removed  REPEAT CESAREAN SECTION 07/01/2012  Dr. Judie Petit. [place]  Irrigation and Debridement Left Thigh 08/05/2015  Dr. Judie Petit. [place]  HEMORRHOIDECTOMY INTERNAL & EXTERNAL 05/23/2016  Dr. Milford Cage    Allergies  Allergen Reactions  Aspirin Diarrhea, Headache and Vomiting   Current Outpatient Medications on File Prior to Visit  Medication Sig Dispense Refill  PRENATAL VITS W-CA,FE,FA,1MG , (PRENATAL VITAMIN ORAL) Take by mouth daily.  venlafaxine (EFFEXOR-XR) 150 MG XR capsule   No current facility-administered medications on file prior to visit.   Family History  Problem Relation Age of Onset  High blood pressure (Hypertension) Father  Hyperlipidemia (Elevated cholesterol) Father  Coronary Artery Disease (Blocked arteries around heart)  Father  Irregular Heart Beat (Arrhythmia) Neg Hx  Congenital heart disease Neg Hx  Birth defects Neg Hx  Sudden death (unexpected death due to unknown cause) Neg Hx    Social History   Tobacco Use  Smoking Status Every Day  Current packs/day: 1.00  Types: Cigarettes  Smokeless Tobacco Never    Social History   Socioeconomic History  Marital status: Single  Tobacco Use  Smoking status: Every Day  Current packs/day: 1.00  Types: Cigarettes  Smokeless tobacco: Never  Substance and Sexual Activity  Alcohol use: Yes  Comment: Socially  Drug use: No   Objective:   Vitals:  11/22/22 1619  Pulse: 96  Temp: 37.1 C (98.7 F)  SpO2: 97%  Weight: 70.6 kg (155 lb 9.6 oz)  Height: 163.2 cm (5' 4.25")  PainSc: 0-No pain   Body mass index is 26.5 kg/m.  Physical Exam Constitutional:  Appearance: Normal appearance.  HENT:  Head: Normocephalic and atraumatic.  Pulmonary:  Effort: Pulmonary effort is normal.  Musculoskeletal:  General: Normal range of motion.  Cervical back: Normal range of motion.  Neurological:  General: No focal deficit present.  Mental Status: She is alert and oriented to person, place, and time. Mental status is at baseline.  Psychiatric:  Mood and Affect: Mood normal.  Behavior: Behavior normal.  Thought Content: Thought content normal.     Labs, Imaging and Diagnostic Testing:  I reviewed XR results from 2023 showing device in place  Assessment and Plan:   Diagnoses and all orders for this visit:  Nexplanon in place    We discussed procedure to remove the device.  Risks such as bleeding, infection, wound breakdown, heart attack, death, and other risks were discussed. I noted a good likelihood this will help address the problem. Possibility that  this will not correct all symptoms was explained. We will work to minimize complications. Questions were answered. The patient expresses understanding & wishes to proceed with surgery.

## 2022-12-10 NOTE — Anesthesia Postprocedure Evaluation (Signed)
Anesthesia Post Note  Patient: Kelsey [name]  Procedure(s) Performed: EXCISION FOREIGN BODY LEFT UPPER ARM (Left)     Patient location during evaluation: PACU Anesthesia Type: MAC Level of consciousness: awake and alert Pain management: pain level controlled Vital Signs Assessment: post-procedure vital signs reviewed and stable Respiratory status: spontaneous breathing, nonlabored ventilation, respiratory function stable and patient connected to nasal cannula oxygen Cardiovascular status: stable and blood pressure returned to baseline Postop Assessment: no apparent nausea or vomiting Anesthetic complications: no  No notable events documented.  Last Vitals:  Vitals:   12/10/22 1545 12/10/22 1559  BP: 126/74 133/83  Pulse: (!) 51 (!) 59  Resp: 13 14  Temp: (!) 36.4 C 36.4 C  SpO2: 99% 100%    Last Pain:  Vitals:   12/10/22 1559  TempSrc: Oral  PainSc: 0-No pain                 Shelton Silvas

## 2022-12-10 NOTE — Discharge Instructions (Signed)
Managing Your Pain After Surgery Without Opioids    Thank you for participating in our program to help patients manage their pain after surgery without opioids. This is part of our effort to provide you with the best care possible, without exposing you or your family to the risk that opioids pose.  What pain can I expect after surgery? You can expect to have some pain after surgery. This is normal. The pain is typically worse the day after surgery, and quickly begins to get better. Many studies have found that many patients are able to manage their pain after surgery with Over-the-Counter (OTC) medications such as Tylenol and Motrin. If you have a condition that does not allow you to take Tylenol or Motrin, notify your surgical team.  How will I manage my pain? The best strategy for controlling your pain after surgery is around the clock pain control with Tylenol (acetaminophen) and Motrin (ibuprofen or Advil). Alternating these medications with each other allows you to maximize your pain control. In addition to Tylenol and Motrin, you can use heating pads or ice packs on your incisions to help reduce your pain.  How will I alternate your regular strength over-the-counter pain medication? You will take a dose of pain medication every three hours. Start by taking 650 mg of Tylenol (2 pills of 325 mg) 3 hours later take 600 mg of Motrin (3 pills of 200 mg) 3 hours after taking the Motrin take 650 mg of Tylenol 3 hours after that take 600 mg of Motrin.   - 1 -  See example - if your first dose of Tylenol is at 12:00 PM   12:00 PM Tylenol 650 mg (2 pills of 325 mg)  3:00 PM Motrin 600 mg (3 pills of 200 mg)  6:00 PM Tylenol 650 mg (2 pills of 325 mg)  9:00 PM Motrin 600 mg (3 pills of 200 mg)  Continue alternating every 3 hours   We recommend that you follow this schedule around-the-clock for at least 3 days after surgery, or until you feel that it is no longer needed. Use the table  on the last page of this handout to keep track of the medications you are taking. Important: Do not take more than '3000mg'$  of Tylenol or '3200mg'$  of Motrin in a 24-hour period. Do not take ibuprofen/Motrin if you have a history of bleeding stomach ulcers, severe kidney disease, &/or actively taking a blood thinner  What if I still have pain? If you have pain that is not controlled with the over-the-counter pain medications (Tylenol and Motrin or Advil) you might have what we call "breakthrough" pain. You will receive a prescription for a small amount of an opioid pain medication such as Oxycodone, Tramadol, or Tylenol with Codeine. Use these opioid pills in the first 24 hours after surgery if you have breakthrough pain. Do not take more than 1 pill every 4-6 hours.  If you still have uncontrolled pain after using all opioid pills, don't hesitate to call our staff using the number provided. We will help make sure you are managing your pain in the best way possible, and if necessary, we can provide a prescription for additional pain medication.   Day 1    Time  Name of Medication Number of pills taken  Amount of Acetaminophen  Pain Level   Comments  AM PM       AM PM       AM PM  AM PM       AM PM       AM PM       AM PM       AM PM       Total Daily amount of Acetaminophen Do not take more than  3,000 mg per day      Day 2    Time  Name of Medication Number of pills taken  Amount of Acetaminophen  Pain Level   Comments  AM PM       AM PM       AM PM       AM PM       AM PM       AM PM       AM PM       AM PM       Total Daily amount of Acetaminophen Do not take more than  3,000 mg per day      Day 3    Time  Name of Medication Number of pills taken  Amount of Acetaminophen  Pain Level   Comments  AM PM       AM PM       AM PM       AM PM         AM PM       AM PM       AM PM       AM PM       Total Daily amount of Acetaminophen Do not take more  than  3,000 mg per day      Day 4    Time  Name of Medication Number of pills taken  Amount of Acetaminophen  Pain Level   Comments  AM PM       AM PM       AM PM       AM PM       AM PM       AM PM       AM PM       AM PM       Total Daily amount of Acetaminophen Do not take more than  3,000 mg per day      Day 5    Time  Name of Medication Number of pills taken  Amount of Acetaminophen  Pain Level   Comments  AM PM       AM PM       AM PM       AM PM       AM PM       AM PM       AM PM       AM PM       Total Daily amount of Acetaminophen Do not take more than  3,000 mg per day      Day 6    Time  Name of Medication Number of pills taken  Amount of Acetaminophen  Pain Level  Comments  AM PM       AM PM       AM PM       AM PM       AM PM       AM PM       AM PM       AM PM       Total Daily amount of Acetaminophen Do not take more than  3,000 mg  per day      Day 7    Time  Name of Medication Number of pills taken  Amount of Acetaminophen  Pain Level   Comments  AM PM       AM PM       AM PM       AM PM       AM PM       AM PM       AM PM       AM PM       Total Daily amount of Acetaminophen Do not take more than  3,000 mg per day        For additional information about how and where to safely dispose of unused opioid medications - [employer]  Disclaimer: This document contains information and/or instructional materials adapted from [place] for the typical patient with your condition. It does not replace medical advice from your health care provider because your experience may differ from that of the typical patient. Talk to your health care provider if you have any questions about this document, your condition or your treatment plan. Adapted from [place]

## 2022-12-10 NOTE — Transfer of Care (Signed)
Immediate Anesthesia Transfer of Care Note  Patient: Kelsey [name]  Procedure(s) Performed: EXCISION FOREIGN BODY LEFT UPPER ARM (Left)  Patient Location: PACU  Anesthesia Type:MAC  Level of Consciousness: awake, alert , oriented, and patient cooperative  Airway & Oxygen Therapy: Patient Spontanous Breathing and Patient connected to face mask oxygen  Post-op Assessment: Report given to RN, Post -op Vital signs reviewed and stable, and Patient moving all extremities  Post vital signs: Reviewed and stable  Last Vitals:  Vitals Value Taken Time  BP    Temp    Pulse 76 12/10/22 1513  Resp 17 12/10/22 1513  SpO2 100 % 12/10/22 1513  Vitals shown include unfiled device data.  Last Pain:  Vitals:   12/10/22 1130  TempSrc:   PainSc: 0-No pain         Complications: No notable events documented.

## 2022-12-11 ENCOUNTER — Encounter (HOSPITAL_COMMUNITY): Payer: Self-pay | Admitting: General Surgery

## 2022-12-14 LAB — SURGICAL PATHOLOGY

## 2022-12-17 ENCOUNTER — Ambulatory Visit: Payer: Medicaid Other | Admitting: Family Medicine

## 2022-12-17 VITALS — BP 124/80 | HR 74 | Wt 156.4 lb

## 2022-12-17 DIAGNOSIS — F419 Anxiety disorder, unspecified: Secondary | ICD-10-CM | POA: Diagnosis not present

## 2022-12-17 DIAGNOSIS — Z2989 Encounter for other specified prophylactic measures: Secondary | ICD-10-CM | POA: Diagnosis not present

## 2022-12-17 DIAGNOSIS — R399 Unspecified symptoms and signs involving the genitourinary system: Secondary | ICD-10-CM

## 2022-12-17 DIAGNOSIS — R718 Other abnormality of red blood cells: Secondary | ICD-10-CM

## 2022-12-17 DIAGNOSIS — F32A Depression, unspecified: Secondary | ICD-10-CM

## 2022-12-17 DIAGNOSIS — R351 Nocturia: Secondary | ICD-10-CM

## 2022-12-17 DIAGNOSIS — G4700 Insomnia, unspecified: Secondary | ICD-10-CM | POA: Diagnosis not present

## 2022-12-17 DIAGNOSIS — N939 Abnormal uterine and vaginal bleeding, unspecified: Secondary | ICD-10-CM

## 2022-12-17 DIAGNOSIS — Z308 Encounter for other contraceptive management: Secondary | ICD-10-CM

## 2022-12-17 DIAGNOSIS — R102 Pelvic and perineal pain unspecified side: Secondary | ICD-10-CM

## 2022-12-17 DIAGNOSIS — F331 Major depressive disorder, recurrent, moderate: Secondary | ICD-10-CM | POA: Diagnosis not present

## 2022-12-17 LAB — POCT URINALYSIS DIP (MANUAL ENTRY)
Bilirubin, UA: NEGATIVE
Blood, UA: NEGATIVE
Glucose, UA: NEGATIVE mg/dL
Ketones, POC UA: NEGATIVE mg/dL
Leukocytes, UA: NEGATIVE
Nitrite, UA: NEGATIVE
Protein Ur, POC: NEGATIVE mg/dL
Spec Grav, UA: <=1.005 — AB (ref 1.010–1.025)
Urobilinogen, UA: 0.2 U/dL
pH, UA: 5.5 (ref 5.0–8.0)

## 2022-12-17 MED ORDER — VENLAFAXINE HCL ER 150 MG PO CP24: 150.0000 mg | ORAL_CAPSULE | Freq: Every day | ORAL | Status: DC

## 2022-12-17 MED ORDER — NICOTINE 14 MG/24HR TD PT24: 14.0000 mg | MEDICATED_PATCH | Freq: Every day | TRANSDERMAL | 1 refills | Status: AC

## 2022-12-17 NOTE — Assessment & Plan Note (Signed)
Resolved with removal of nexplanon.

## 2022-12-17 NOTE — Progress Notes (Unsigned)
   SUBJECTIVE:   CHIEF COMPLAINT / HPI:   Anxiety - Medications: effexor - Taking: good compliance - Counseling: yes - Symptoms: trouble with staying asleep. Pees and smokes when getting up.   Insomnia - trouble staying asleep. No trouble going to sleep.  - goes to bed around 8-9pm. Melatonin helps her go to sleep. Has normal bedtime routine though plays games on her phone to get her mind to wind down prior to bed. Doesn't think she snores. Does often have morning headaches. Has few episodes of nocturia every night. Goes to gym in am - never had sleep study  Contraception - recent surgery to remove adhered nexplanon 12/10/22. Has had more energy since removing. LMP 11/18/22. Doesn't have sex often. Not interested in other contraception at this time.   URINARY SYMPTOMS - dull pelvic pain intermittent for a year, mainly suprapubic discomfort, sometimes lower back Dysuria: not while urinating but will feel after urinating Urinary incontinence: no Foul odor: no Hematuria: no Suprapubic pain/pressure: yes Flank pain: occasional Fever:  no Vomiting: no Relief with cranberry juice: no Relief with pyridium: yes Status: better/worse/stable Previous urinary tract infection: yes Recurrent urinary tract infection: no Sexual activity: monogomous Vaginal  discharge: no Treatments attempted: antibiotics, pyridium, and cranberry    OBJECTIVE:   BP 124/80   Pulse 74   Wt 156 lb 6.4 oz (70.9 kg)   LMP 11/18/2022 (Exact Date) Comment: negative urine pregnancy test 12-10-22  SpO2 98%   BMI 26.23 kg/m   Gen: well appearing, in NAD Card: RRR Lungs: CTAB Abd: soft, nonTTP, +BS. No guarding or rebound. Ext: WWP, no edema   ASSESSMENT/PLAN:   Pelvic pain Unclear etiology. With some correlation to urinary symptoms and relief with antibiotic for prior UTIs. Current UA not indicative of infection and symptoms ongoing intermittently for the last year, will obtain culture and treat if  positive. Prior pelvic exam wnl with negative STI testing. If Ucx unrevealing and remains symptomatic, consider pelvic US.    Consider pelvis US  Kelsey Laroche, DO

## 2022-12-17 NOTE — Assessment & Plan Note (Signed)
Discussed options, elects to continue with condoms for now.

## 2022-12-17 NOTE — Assessment & Plan Note (Signed)
Unclear etiology. With some correlation to urinary symptoms and relief with antibiotic for prior UTIs. Current UA not indicative of infection and symptoms ongoing intermittently for the last year, will obtain culture and treat if positive. Prior pelvic exam wnl with negative STI testing. If Ucx unrevealing and remains symptomatic, consider pelvic US.

## 2022-12-17 NOTE — Assessment & Plan Note (Signed)
Doing well on current dose however fragmented sleep affecting mood. PHQ improved since restarting effexor and counseling. Discussed sleep hygiene. Will obtain sleep study given frequent nocturia and morning headaches. F/u 3 months, repeat PHQ, GAD at that time and adjust regimen as indicated.

## 2022-12-17 NOTE — Patient Instructions (Addendum)
It was great to see you!  Our plans for today:  - We are getting a sleep study. See below for ways to help you sleep better.  - We are checking some labs today, we will release these results to your MyChart. - Make sure to wear condoms when you have sex.  - Come back in 3 months.   Take care and seek immediate care sooner if you develop any concerns.   Dr. Linwood Dibbles   - Try the following to help you sleep better:  - limit naps during the day  - no screens (TV, phone, tablet, computer) at least 1-2 hours before bedtime.  - have a quiet and dark sleeping environment.  - no large meals or drinks about 1 hour before bed.  - Avoid taking diuretics (hydrochlorothiazide, furosemide) in the evenings.  - Avoid caffeine after 3pm.  - Exercise or move your body regularly every day.  - You can also try melatonin 1-2 mg over the counter. Take this 1-2 hours before bed. - If you are lying in bed for 30 mins-1 hour and aren't falling asleep, get out of bed and do something relaxing like reading (NO TV!) until you are tired.

## 2022-12-18 LAB — CBC
Hematocrit: 42.1 % (ref 34.0–46.6)
Hemoglobin: 13.7 g/dL (ref 11.1–15.9)
MCH: 34 pg — ABNORMAL HIGH (ref 26.6–33.0)
MCHC: 32.5 g/dL (ref 31.5–35.7)
MCV: 105 fL — ABNORMAL HIGH (ref 79–97)
Platelets: 305 10*3/uL (ref 150–450)
RBC: 4.03 x10E6/uL (ref 3.77–5.28)
RDW: 12.1 % (ref 11.7–15.4)
WBC: 9.3 10*3/uL (ref 3.4–10.8)

## 2022-12-18 LAB — VITAMIN B12: Vitamin B-12: 1183 pg/mL (ref 232–1245)

## 2022-12-18 LAB — FOLATE: Folate: 5.1 ng/mL (ref >3.0)

## 2022-12-19 LAB — URINE CULTURE

## 2022-12-19 NOTE — Addendum Note (Signed)
Addended by: Caro Laroche on: 12/19/2022 09:09 AM   Modules accepted: Orders

## 2022-12-24 ENCOUNTER — Ambulatory Visit (HOSPITAL_COMMUNITY): Payer: Medicaid Other

## 2022-12-28 ENCOUNTER — Ambulatory Visit (HOSPITAL_COMMUNITY): Admission: RE | Admit: 2022-12-28 | Payer: Medicaid Other | Source: Ambulatory Visit

## 2023-01-02 DIAGNOSIS — I808 Phlebitis and thrombophlebitis of other sites: Secondary | ICD-10-CM | POA: Diagnosis not present

## 2023-01-02 DIAGNOSIS — Z975 Presence of (intrauterine) contraceptive device: Secondary | ICD-10-CM | POA: Diagnosis not present

## 2023-05-17 ENCOUNTER — Encounter: Payer: Self-pay | Admitting: Family Medicine

## 2023-05-17 ENCOUNTER — Ambulatory Visit: Admitting: Family Medicine

## 2023-05-17 VITALS — BP 124/75 | HR 97 | Ht 64.0 in | Wt 146.2 lb

## 2023-05-17 DIAGNOSIS — F32A Depression, unspecified: Secondary | ICD-10-CM

## 2023-05-17 DIAGNOSIS — F419 Anxiety disorder, unspecified: Secondary | ICD-10-CM | POA: Diagnosis not present

## 2023-05-17 DIAGNOSIS — F331 Major depressive disorder, recurrent, moderate: Secondary | ICD-10-CM

## 2023-05-17 DIAGNOSIS — F908 Attention-deficit hyperactivity disorder, other type: Secondary | ICD-10-CM | POA: Diagnosis not present

## 2023-05-17 MED ORDER — METHYLPHENIDATE HCL ER (OSM) 18 MG PO TBCR: 18.0000 mg | EXTENDED_RELEASE_TABLET | Freq: Every day | ORAL | 0 refills | Status: DC

## 2023-05-17 MED ORDER — VENLAFAXINE HCL ER 150 MG PO CP24: 150.0000 mg | ORAL_CAPSULE | Freq: Every day | ORAL | 0 refills | Status: DC

## 2023-05-17 NOTE — Patient Instructions (Signed)
 It was great to see you!  Our plans for today:  - We are retrying the concerta. - I refilled the effexor. - Come back in 1 month.   Take care and seek immediate care sooner if you develop any concerns.   Dr. Linwood Dibbles

## 2023-05-17 NOTE — Assessment & Plan Note (Signed)
 Doing well though some symptoms with uncontrolled ADHD, see above. Effexor refill provided. Consider titration of regimen if symptoms not controlled at follow up.

## 2023-05-17 NOTE — Progress Notes (Signed)
    SUBJECTIVE:   CHIEF COMPLAINT / HPI:   ADHD - has previous history. Previously on concerta but had to come off due to insurance issues. Then was obtaining adderall as needed from friend. Has been doing well so far when wasn't working but having trouble since getting back to work recently.  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  - symptoms include feeling fidgety, difficulty concentrating or staying focused on tasks. Difficulty sleeping.  Anxiety - Medications: effexor - Taking: good compliance - Counseling: yes - Symptoms: trouble sleeping   OBJECTIVE:   BP 124/75   Pulse 97   Ht 5\' 4"  (1.626 m)   Wt 146 lb 3.2 oz (66.3 kg)   SpO2 98%   BMI 25.10 kg/m   Gen: well appearing, in NAD, fidgety. Card: Reg rate Lungs: Comfortable WOB on RA Ext: WWP, no edema   ASSESSMENT/PLAN:   Attention deficit hyperactivity disorder (ADHD) Chronic, not well controlled recently as getting back to work with 2 jobs. Will trail concerta.  Anxiety and depression Doing well though some symptoms with uncontrolled ADHD, see above. Effexor refill provided. Consider titration of regimen if symptoms not controlled at follow up.     Caro Laroche, DO

## 2023-05-17 NOTE — Assessment & Plan Note (Signed)
 Chronic, not well controlled recently as getting back to work with 2 jobs. Will trail concerta.

## 2023-07-25 ENCOUNTER — Other Ambulatory Visit: Payer: Self-pay

## 2023-07-25 DIAGNOSIS — F331 Major depressive disorder, recurrent, moderate: Secondary | ICD-10-CM

## 2023-07-25 MED ORDER — VENLAFAXINE HCL ER 150 MG PO CP24: 150.0000 mg | ORAL_CAPSULE | Freq: Every day | ORAL | 0 refills | Status: DC

## 2023-08-05 ENCOUNTER — Ambulatory Visit: Admitting: Family Medicine

## 2023-08-05 NOTE — Progress Notes (Deleted)
   SUBJECTIVE:   CHIEF COMPLAINT / HPI:   ADHD - has previous history as a child. Previously on concerta  but had to come off due to insurance issues. Then was obtaining adderall as needed from friend. Has been doing well so far when wasn't working but having trouble since getting back to work recently.  - last appt restarted on concerta . 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 *** - symptoms include ***  Anxiety - Medications: effexor  - Taking: *** - Counseling: *** - Symptoms: *** - Current stressors: *** - Coping Mechanisms: ***   OBJECTIVE:   There were no vitals taken for this visit.  ***  ASSESSMENT/PLAN:   No problem-specific Assessment & Plan notes found for this encounter.     Donald CHRISTELLA Lai, DO

## 2023-09-20 IMAGING — CR DG HUMERUS 2V *L*
2 series · 2 of 2 positions shown · non-contrast
Comparison: None.

CLINICAL DATA: Nexplanon displacement in the left arm. Inserted in
9890

EXAM:
LEFT HUMERUS - 2+ VIEW

[w humerus ap left]
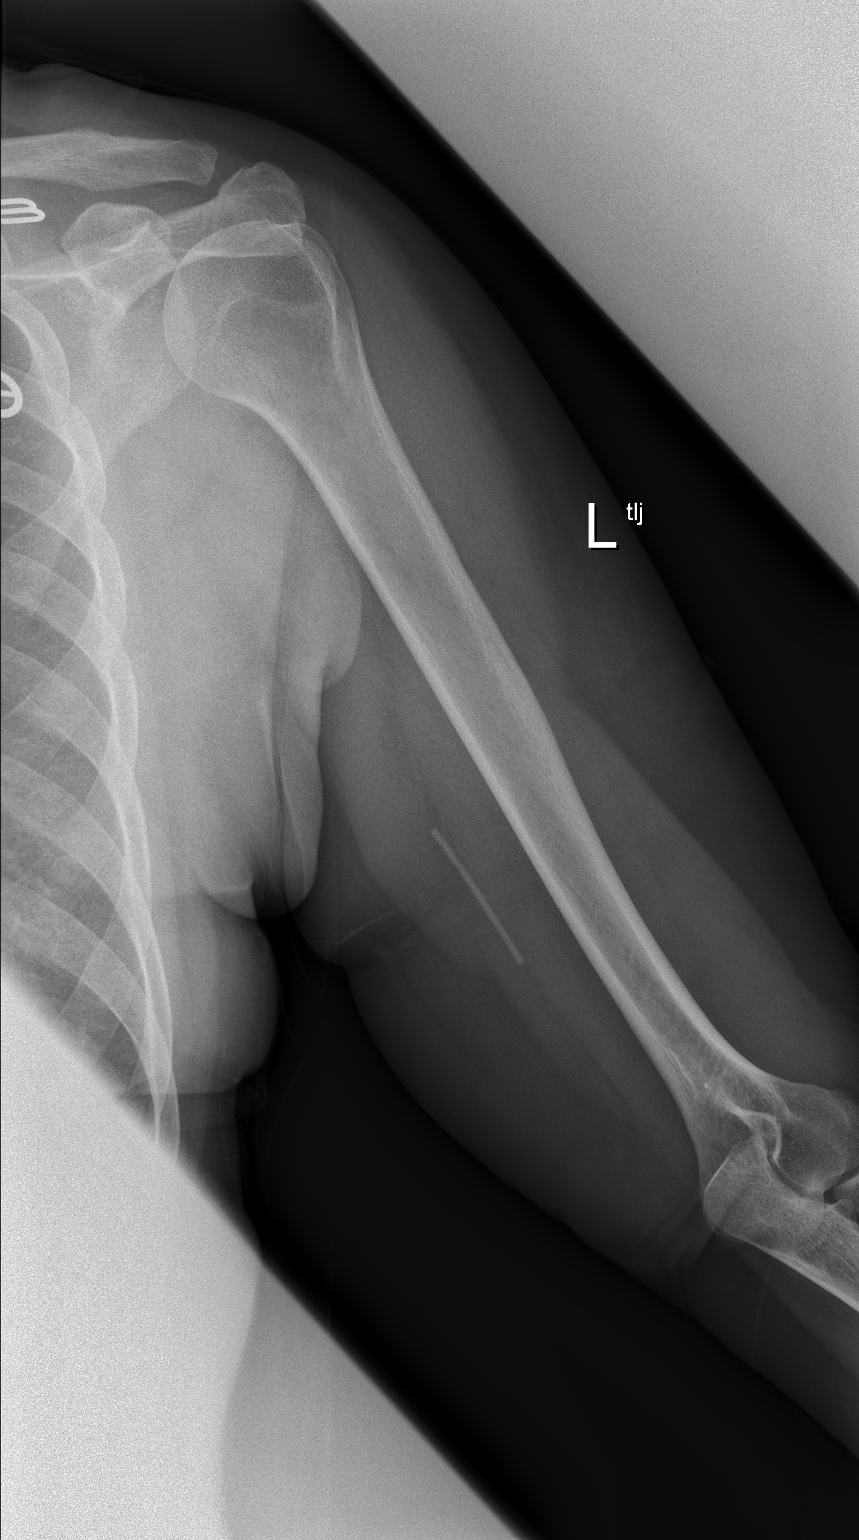

[w humerus lat left]
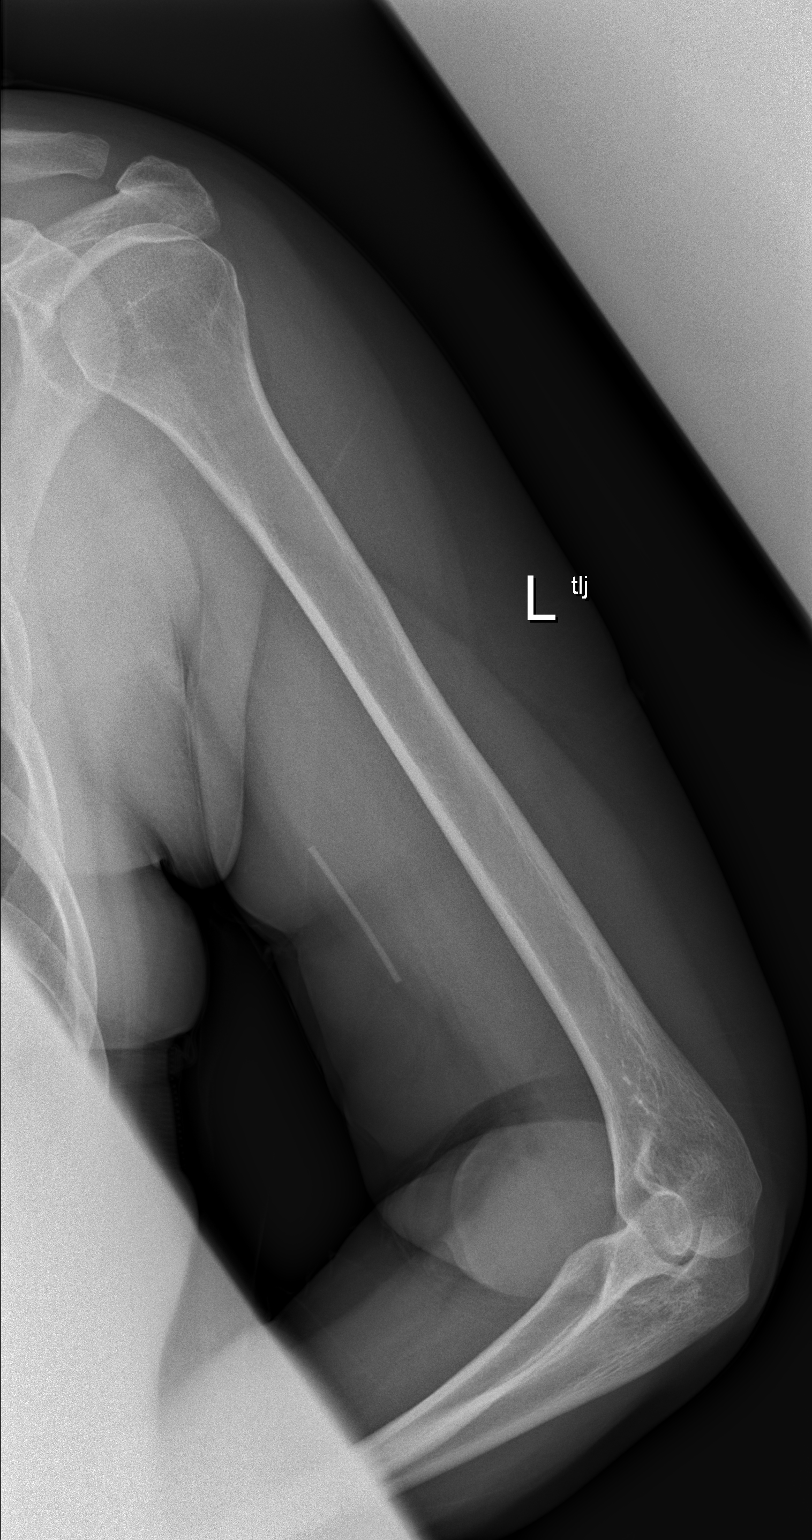

[2 of 2 positions shown; findings below may reference images not displayed]

FINDINGS: There is a 4.4 cm linear radiopaque foreign body overlying the
medial soft tissues of the upper arm. No acute osseous abnormality.
Normal alignment.
IMPRESSION: 4.4 cm linear radiopaque foreign body overlying the medial soft
tissues of the upper arm.

## 2023-10-07 ENCOUNTER — Telehealth: Admitting: Physician Assistant

## 2023-10-07 ENCOUNTER — Telehealth: Admitting: Family Medicine

## 2023-10-07 DIAGNOSIS — R509 Fever, unspecified: Secondary | ICD-10-CM

## 2023-10-07 DIAGNOSIS — R52 Pain, unspecified: Secondary | ICD-10-CM

## 2023-10-07 DIAGNOSIS — J069 Acute upper respiratory infection, unspecified: Secondary | ICD-10-CM | POA: Diagnosis not present

## 2023-10-07 MED ORDER — PSEUDOEPH-BROMPHEN-DM 30-2-10 MG/5ML PO SYRP: 5.0000 mL | ORAL_SOLUTION | Freq: Four times a day (QID) | ORAL | 0 refills | Status: DC | PRN

## 2023-10-07 MED ORDER — PREDNISONE 20 MG PO TABS: 40.0000 mg | ORAL_TABLET | Freq: Every day | ORAL | 0 refills | Status: DC

## 2023-10-07 MED ORDER — ALBUTEROL SULFATE HFA 108 (90 BASE) MCG/ACT IN AERS: 1.0000 | INHALATION_SPRAY | Freq: Four times a day (QID) | RESPIRATORY_TRACT | 0 refills | Status: DC | PRN

## 2023-10-07 NOTE — Progress Notes (Signed)
 Virtual Visit Consent   [name], you are scheduled for a virtual visit with a [facility] provider today. Just as with appointments in the office, your consent must be obtained to participate. Your consent will be active for this visit and any virtual visit you may have with one of our providers in the next 365 days. If you have a MyChart account, a copy of this consent can be sent to you electronically.  As this is a virtual visit, video technology does not allow for your provider to perform a traditional examination. This may limit your provider's ability to fully assess your condition. If your provider identifies any concerns that need to be evaluated in person or the need to arrange testing (such as labs, EKG, etc.), we will make arrangements to do so. Although advances in technology are sophisticated, we cannot ensure that it will always work on either your end or our end. If the connection with a video visit is poor, the visit may have to be switched to a telephone visit. With either a video or telephone visit, we are not always able to ensure that we have a secure connection.  By engaging in this virtual visit, you consent to the provision of healthcare and authorize for your insurance to be billed (if applicable) for the services provided during this visit. Depending on your insurance coverage, you may receive a charge related to this service.  I need to obtain your verbal consent now. Are you willing to proceed with your visit today? [name] has provided verbal consent on 10/07/2023 for a virtual visit (video or telephone). Delon HERO Dickinson, PA-C  Date: 10/07/2023 4:22 PM   Virtual Visit via Video Note   I, Delon HERO Dickinson, connected with  Kelsey [name]  ([id number], Nov 27, 1985) on 10/07/23 at  4:15 PM EDT by a video-enabled telemedicine application and verified that I am speaking with the correct person using two identifiers.  Location: Patient: Virtual Visit  Location Patient: Home Provider: Virtual Visit Location Provider: Home Office   I discussed the limitations of evaluation and management by telemedicine and the availability of in person appointments. The patient expressed understanding and agreed to proceed.    History of Present Illness: Kelsey [name] is a 38 y.o. who identifies as a female who was assigned female at birth, and is being seen today for cough and congestion.  HPI: URI  This is a new problem. The current episode started in the past 7 days (Started Saturday, 10/05/23)). The problem has been unchanged. The maximum temperature recorded prior to her arrival was 102 - 102.9 F (102.1, highest). Associated symptoms include chest pain, congestion, coughing, diarrhea, headaches, rhinorrhea, a sore throat (from drainage) and wheezing. Pertinent negatives include no abdominal pain, ear pain, nausea, plugged ear sensation, sinus pain, swollen glands or vomiting. Associated symptoms comments: Myalgias, back pain. Treatments tried: dayquil, nyquil, Mucinex DM. The treatment provided no relief.     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   SINUSITIS, CHRONIC 07/09/2008   TOBACCO  ABUSE 01/31/2007   Anxiety and depression 04/11/2006   Attention deficit hyperactivity disorder (ADHD) 04/11/2006   VENTRICULAR SEPTAL DEFECT 04/11/2006    Allergies:  Allergies  Allergen Reactions   Aspirin Nausea And Vomiting and Other (See Comments)    HEADACHES   Medications:   Current Outpatient Medications:    albuterol  (VENTOLIN  HFA) 108 (90 Base) MCG/ACT inhaler, Inhale 1-2 puffs into the lungs every 6 (six) hours as needed., Disp: 8 g, Rfl: 0   brompheniramine-pseudoephedrine-DM 30-2-10 MG/5ML syrup, Take 5 mLs by mouth 4 (four) times daily as needed., Disp: 120 mL, Rfl: 0   predniSONE  (DELTASONE ) 20 MG tablet, Take 2 tablets (40 mg total) by mouth daily with breakfast., Disp: 10 tablet, Rfl: 0   acetaminophen  (TYLENOL ) 500 MG tablet, Take 500 mg by mouth as needed for headache., Disp: , Rfl:    ibuprofen  (ADVIL ) 800 MG tablet, Take 1 tablet (800 mg total) by mouth every 8 (eight) hours as needed., Disp: 30 tablet, Rfl: 0   methylphenidate  (CONCERTA ) 18 MG PO CR tablet, Take 1 tablet (18 mg total) by mouth daily., Disp: 30 tablet, Rfl: 0   nicotine  (NICODERM CQ  - DOSED IN MG/24 HOURS) 14 mg/24hr patch, Place 1 patch (14 mg total) onto the skin daily., Disp: 56 patch, Rfl: 1   venlafaxine  XR (EFFEXOR -XR) 150 MG 24 hr capsule, Take 1 capsule (150 mg total) by mouth daily with breakfast. Please schedule appt with MD before further refills., Disp: 30 capsule, Rfl: 0  Observations/Objective: Patient is well-developed, well-nourished in no acute distress.  Resting comfortably at home.  Head is normocephalic, atraumatic.  No labored breathing.  Speech is clear and coherent with logical content.  Patient is alert and oriented at baseline.    Assessment and Plan: 1. Viral URI with cough (Primary) - albuterol  (VENTOLIN  HFA) 108 (90 Base) MCG/ACT inhaler; Inhale 1-2 puffs into the lungs every 6 (six) hours as needed.  Dispense: 8 g; Refill: 0 - predniSONE  (DELTASONE ) 20 MG tablet; Take 2 tablets (40 mg total) by mouth daily with breakfast.  Dispense: 10 tablet; Refill: 0 - brompheniramine-pseudoephedrine-DM 30-2-10 MG/5ML syrup; Take 5 mLs by mouth 4 (four) times daily as needed.  Dispense: 120 mL; Refill: 0  - Suspect viral URI, advised could take at home Covid + Flu  test if desired and follow up if positive - Symptomatic medications of choice over the counter as needed - Add Bromfed DM, Albuterol , and Prednisone  - Push fluids - Rest - Seek further evaluation if symptoms change or worsen   Follow Up Instructions: I discussed the assessment and treatment plan with the patient. The patient was provided an opportunity to ask questions and all were answered. The patient agreed with the plan and demonstrated an understanding of the instructions.  A copy of instructions were sent to the patient via MyChart unless otherwise noted below.    The patient was advised to call back or seek an in-person evaluation if the symptoms worsen or if the condition fails to improve as anticipated.    Delon CHRISTELLA Dickinson, PA-C

## 2023-10-07 NOTE — Patient Instructions (Signed)
 Kelsey [name], thank you for joining Kelsey HERO Dickinson, PA-C for today's virtual visit.  While this provider is not your primary care provider (PCP), if your PCP is located in our provider database this encounter information will be shared with them immediately following your visit.   A [facility] MyChart account gives you access to today's visit and all your visits, tests, and labs performed at [facility]  click here if you don't have a [facility] MyChart account or go to [email or web address]  Consent: (Patient) Kelsey [name] provided verbal consent for this virtual visit at the beginning of the encounter.  Current Medications:  Current Outpatient Medications:    albuterol  (VENTOLIN  HFA) 108 (90 Base) MCG/ACT inhaler, Inhale 1-2 puffs into the lungs every 6 (six) hours as needed., Disp: 8 g, Rfl: 0   brompheniramine-pseudoephedrine-DM 30-2-10 MG/5ML syrup, Take 5 mLs by mouth 4 (four) times daily as needed., Disp: 120 mL, Rfl: 0   predniSONE  (DELTASONE ) 20 MG tablet, Take 2 tablets (40 mg total) by mouth daily with breakfast., Disp: 10 tablet, Rfl: 0   acetaminophen  (TYLENOL ) 500 MG tablet, Take 500 mg by mouth as needed for headache., Disp: , Rfl:    ibuprofen  (ADVIL ) 800 MG tablet, Take 1 tablet (800 mg total) by mouth every 8 (eight) hours as needed., Disp: 30 tablet, Rfl: 0   methylphenidate  (CONCERTA ) 18 MG PO CR tablet, Take 1 tablet (18 mg total) by mouth daily., Disp: 30 tablet, Rfl: 0   nicotine  (NICODERM CQ  - DOSED IN MG/24 HOURS) 14 mg/24hr patch, Place 1 patch (14 mg total) onto the skin daily., Disp: 56 patch, Rfl: 1   venlafaxine  XR (EFFEXOR -XR) 150 MG 24 hr capsule, Take 1 capsule (150 mg total) by mouth daily with breakfast. Please schedule appt with MD before further refills., Disp: 30 capsule, Rfl: 0   Medications ordered in this encounter:  Meds ordered this encounter  Medications   albuterol  (VENTOLIN  HFA) 108 (90 Base) MCG/ACT inhaler     Sig: Inhale 1-2 puffs into the lungs every 6 (six) hours as needed.    Dispense:  8 g    Refill:  0    Supervising Provider:   BLAISE ALEENE KIDD [id number]   predniSONE  (DELTASONE ) 20 MG tablet    Sig: Take 2 tablets (40 mg total) by mouth daily with breakfast.    Dispense:  10 tablet    Refill:  0    Supervising Provider:   LAMPTEY, PHILIP O [[id number]]   brompheniramine-pseudoephedrine-DM 30-2-10 MG/5ML syrup    Sig: Take 5 mLs by mouth 4 (four) times daily as needed.    Dispense:  120 mL    Refill:  0    Supervising Provider:   BLAISE ALEENE KIDD [[id number]]     *If you need refills on other medications prior to your next appointment, please contact your pharmacy*  Follow-Up: Call back or seek an in-person evaluation if the symptoms worsen or if the condition fails to improve as anticipated.  [facility] [phone]  Other Instructions  Viral Respiratory Infection A respiratory infection is an illness that affects part of the respiratory system, such as the lungs, nose, or throat. A respiratory infection that is caused by a virus is called a viral respiratory infection. Common types of viral respiratory infections include: A cold. The flu (influenza). A respiratory syncytial virus (RSV) infection. What are the causes? This condition is caused by a virus. The virus may spread through contact with  droplets or direct contact with infected people or their mucus or secretions. The virus may spread from person to person (is contagious). What are the signs or symptoms? Symptoms of this condition include: A stuffy or runny nose. A sore throat or cough. Shortness of breath or difficulty breathing. Yellow or green mucus (sputum). Other symptoms may include: A fever. Sweating or chills. Fatigue. Achy muscles. A headache. How is this diagnosed? This condition may be diagnosed based on: Your symptoms. A physical exam. Testing of secretions from the nose or  throat. Chest X-ray. How is this treated? This condition may be treated with medicines, such as: Antiviral medicine. This may shorten the length of time a person has symptoms. Expectorants. These make it easier to cough up mucus. Decongestant nasal sprays. Acetaminophen  or NSAIDs, such as ibuprofen , to relieve fever and pain. Antibiotic medicines are not prescribed for viral infections.This is because antibiotics are designed to kill bacteria. They do not kill viruses. Follow these instructions at home: Managing pain and congestion Take over-the-counter and prescription medicines only as told by your health care provider. If you have a sore throat, gargle with a mixture of salt and water 3-4 times a day or as needed. To make salt water, completely dissolve -1 tsp (3-6 g) of salt in 1 cup (237 mL) of warm water. Use nose drops made from salt water to ease congestion and soften raw skin around your nose. Take 2 tsp (10 mL) of honey at bedtime to lessen coughing at night. Do not give honey to children who are younger than 1 year. Drink enough fluid to keep your urine pale yellow. This helps prevent dehydration and helps loosen up mucus. General instructions  Rest as much as possible. Do not drink alcohol. Do not use any products that contain nicotine  or tobacco. These products include cigarettes, chewing tobacco, and vaping devices, such as e-cigarettes. If you need help quitting, ask your health care provider. Keep all follow-up visits. This is important. How is this prevented?     Get an annual flu shot. You may get the flu shot in late summer, fall, or winter. Ask your health care provider when you should get your flu shot. Avoid spreading your infection to other people. If you are sick: Wash your hands with soap and water often, especially after you cough or sneeze. Wash for at least 20 seconds. If soap and water are not available, use alcohol-based hand sanitizer. Cover your mouth  when you cough. Cover your nose and mouth when you sneeze. Do not share cups or eating utensils. Clean commonly used objects often. Clean commonly touched surfaces. Stay home from work or school as told by your health care provider. Avoid contact with people who are sick during cold and flu season. This is generally fall and winter. Contact a health care provider if: Your symptoms last for 10 days or longer. Your symptoms get worse over time. You have severe sinus pain in your face or forehead. The glands in your jaw or neck become very swollen. You have shortness of breath. Get help right away if you: Feel pain or pressure in your chest. Have trouble breathing. Faint or feel like you will faint. Have severe and persistent vomiting. Feel confused or disoriented. These symptoms may represent a serious problem that is an emergency. Do not wait to see if the symptoms will go away. Get medical help right away. Call your local emergency services (911 in the [place] Do not drive yourself to the  hospital. Summary A respiratory infection is an illness that affects part of the respiratory system, such as the lungs, nose, or throat. A respiratory infection that is caused by a virus is called a viral respiratory infection. Common types of viral respiratory infections include a cold, influenza, and respiratory syncytial virus (RSV) infection. Symptoms of this condition include a stuffy or runny nose, cough, fatigue, achy muscles, sore throat, and fevers or chills. Antibiotic medicines are not prescribed for viral infections. This is because antibiotics are designed to kill bacteria. They are not effective against viruses. This information is not intended to replace advice given to you by your health care provider. Make sure you discuss any questions you have with your health care provider. Document Revised: 05/05/2020 Document Reviewed: 05/05/2020 Elsevier Patient Education  2024 Elsevier Inc.   If  you have been instructed to have an in-person evaluation today at a local Urgent Care facility, please use the link below. It will take you to a list of all of our available [facility], including address, phone number and hours of operation. Please do not delay care.  [facility]  If you or a family member do not have a primary care provider, use the link below to schedule a visit and establish care. When you choose a [facility] primary care physician or advanced practice provider, you gain a long-term partner in health. Find a Primary Care Provider  Learn more about [facility]'s in-office and virtual care options: [facility] - Get Care Now

## 2023-10-07 NOTE — Progress Notes (Signed)
  Thank you for the details you included in the comment boxes. Those details are very helpful in determining the best course of treatment for you and help us  to provide the best care.Because you have some overlapping flu/covid symptoms, we recommend that you schedule a Virtual Urgent Care video visit in order for the provider to better assess what is going on.  The provider will be able to give you a more accurate diagnosis and treatment plan if we can more freely discuss your symptoms and with the addition of a virtual examination.   If you change your visit to a video visit, we will bill your insurance (similar to an office visit) and you will not be charged for this e-Visit. You will be able to stay at home and speak with the first available [facility] advanced practice provider. The link to do a video visit is in the drop down Menu tab of your Welcome screen in MyChart.

## 2023-10-13 ENCOUNTER — Telehealth: Admitting: Family

## 2023-10-13 DIAGNOSIS — J208 Acute bronchitis due to other specified organisms: Secondary | ICD-10-CM | POA: Diagnosis not present

## 2023-10-13 DIAGNOSIS — B9689 Other specified bacterial agents as the cause of diseases classified elsewhere: Secondary | ICD-10-CM

## 2023-10-13 MED ORDER — PROMETHAZINE-DM 6.25-15 MG/5ML PO SYRP: 5.0000 mL | ORAL_SOLUTION | Freq: Three times a day (TID) | ORAL | 0 refills | Status: DC | PRN

## 2023-10-13 MED ORDER — AZITHROMYCIN 250 MG PO TABS: ORAL_TABLET | ORAL | 0 refills | Status: DC

## 2023-10-13 MED ORDER — ALBUTEROL SULFATE HFA 108 (90 BASE) MCG/ACT IN AERS: 2.0000 | INHALATION_SPRAY | Freq: Four times a day (QID) | RESPIRATORY_TRACT | 0 refills | Status: AC | PRN

## 2023-10-13 NOTE — Progress Notes (Signed)
 Virtual Visit Consent   Kelsey [name], you are scheduled for a virtual visit with a [facility] provider today. Just as with appointments in the office, your consent must be obtained to participate. Your consent will be active for this visit and any virtual visit you may have with one of our providers in the next 365 days. If you have a MyChart account, a copy of this consent can be sent to you electronically.  As this is a virtual visit, video technology does not allow for your provider to perform a traditional examination. This may limit your provider's ability to fully assess your condition. If your provider identifies any concerns that need to be evaluated in person or the need to arrange testing (such as labs, EKG, etc.), we will make arrangements to do so. Although advances in technology are sophisticated, we cannot ensure that it will always work on either your end or our end. If the connection with a video visit is poor, the visit may have to be switched to a telephone visit. With either a video or telephone visit, we are not always able to ensure that we have a secure connection.  By engaging in this virtual visit, you consent to the provision of healthcare and authorize for your insurance to be billed (if applicable) for the services provided during this visit. Depending on your insurance coverage, you may receive a charge related to this service.  I need to obtain your verbal consent now. Are you willing to proceed with your visit today? Kelsey [name] has provided verbal consent on 10/13/2023 for a virtual visit (video or telephone). Bari Learn, FNP  Date: 10/13/2023 1:58 PM   Virtual Visit via Video Note   I, Bari Learn, connected with  Kelsey [name]  ([id number], 1985-02-26) on 10/13/23 at  2:00 PM EDT by a video-enabled telemedicine application and verified that I am speaking with the correct person using two identifiers.  Location: Patient: Virtual Visit Location  Patient: Home Provider: Virtual Visit Location Provider: Home Office   I discussed the limitations of evaluation and management by telemedicine and the availability of in person appointments. The patient expressed understanding and agreed to proceed.    History of Present Illness: Kelsey [name] is a 38 y.o. who identifies as a female who was assigned female at birth, and is being seen today for cough and congestion that started last week.  HPI: URI  This is a new problem. The current episode started 1 to 4 weeks ago. The problem has been gradually worsening. Maximum temperature: 55F. Associated symptoms include congestion, coughing, diarrhea, ear pain (right), headaches, joint pain, rhinorrhea, sinus pain, sneezing and wheezing. Pertinent negatives include no nausea, sore throat or swollen glands. She has tried decongestant and increased fluids for the symptoms. The treatment provided mild relief.    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   SINUSITIS, CHRONIC 07/09/2008   TOBACCO ABUSE 01/31/2007   Anxiety and depression 04/11/2006   Attention deficit hyperactivity disorder (ADHD) 04/11/2006   VENTRICULAR SEPTAL DEFECT 04/11/2006  Allergies:  Allergies  Allergen Reactions   Aspirin Nausea And Vomiting and Other (See Comments)    HEADACHES   Medications:  Current Outpatient Medications:    albuterol  (VENTOLIN  HFA) 108 (90 Base) MCG/ACT inhaler, Inhale 2 puffs into the lungs every 6 (six) hours as needed  for wheezing or shortness of breath., Disp: 8 g, Rfl: 0   azithromycin  (ZITHROMAX ) 250 MG tablet, Take 500 mg once, then 250 mg for four days, Disp: 6 tablet, Rfl: 0   promethazine -dextromethorphan (PROMETHAZINE -DM) 6.25-15 MG/5ML syrup, Take 5 mLs by mouth 3 (three) times daily as needed for cough., Disp: 118 mL, Rfl: 0   acetaminophen  (TYLENOL ) 500 MG tablet, Take 500 mg by mouth as needed for headache., Disp: , Rfl:    brompheniramine-pseudoephedrine-DM 30-2-10 MG/5ML syrup, Take 5 mLs by mouth 4 (four) times daily as needed., Disp: 120 mL, Rfl: 0   ibuprofen  (ADVIL ) 800 MG tablet, Take 1 tablet (800 mg total) by mouth every 8 (eight) hours as needed., Disp: 30 tablet, Rfl: 0   methylphenidate  (CONCERTA ) 18 MG PO CR tablet, Take 1 tablet (18 mg total) by mouth daily., Disp: 30 tablet, Rfl: 0   nicotine  (NICODERM CQ  - DOSED IN MG/24 HOURS) 14 mg/24hr patch, Place 1 patch (14 mg total) onto the skin daily., Disp: 56 patch, Rfl: 1   predniSONE  (DELTASONE ) 20 MG tablet, Take 2 tablets (40 mg total) by mouth daily with breakfast., Disp: 10 tablet, Rfl: 0   venlafaxine  XR (EFFEXOR -XR) 150 MG 24 hr capsule, Take 1 capsule (150 mg total) by mouth daily with breakfast. Please schedule appt with MD before further refills., Disp: 30 capsule, Rfl: 0  Observations/Objective: Patient is well-developed, well-nourished in no acute distress.  Resting comfortably  at home.  Head is normocephalic, atraumatic.  No labored breathing.  Speech is clear and coherent with logical content.  Patient is alert and oriented at baseline.  Coarse nonproductive cough  Assessment and Plan: 1. Acute bacterial bronchitis (Primary) - azithromycin  (ZITHROMAX ) 250 MG tablet; Take 500 mg once, then 250 mg for four days  Dispense: 6 tablet; Refill: 0 - albuterol  (VENTOLIN  HFA) 108 (90 Base) MCG/ACT inhaler; Inhale 2 puffs into the lungs every 6 (six) hours as needed for wheezing or shortness of breath.  Dispense: 8 g; Refill: 0 -  promethazine -dextromethorphan (PROMETHAZINE -DM) 6.25-15 MG/5ML syrup; Take 5 mLs by mouth 3 (three) times daily as needed for cough.  Dispense: 118 mL; Refill: 0  - Take meds as prescribed - Use a cool mist humidifier  -Use saline nose sprays frequently -Force fluids -For any cough or congestion  Use plain Mucinex- regular strength or max strength is fine -For fever or aces or pains- take tylenol  or ibuprofen . -Throat lozenges if help -Follow up if symptoms worsen or do not improve   Follow Up Instructions: I discussed the assessment and treatment plan with the patient. The patient was provided an opportunity to ask questions and all were answered. The patient agreed with the plan and demonstrated an understanding of the instructions.  A copy of instructions were sent to the patient via MyChart unless otherwise noted below.     The patient was advised to call back or seek an in-person evaluation if the symptoms worsen or if the condition fails to improve as anticipated.    Bari Learn, FNP

## 2023-11-08 ENCOUNTER — Telehealth: Admitting: Physician Assistant

## 2023-11-08 DIAGNOSIS — R509 Fever, unspecified: Secondary | ICD-10-CM

## 2023-11-08 DIAGNOSIS — M545 Low back pain, unspecified: Secondary | ICD-10-CM

## 2023-11-08 DIAGNOSIS — R300 Dysuria: Secondary | ICD-10-CM

## 2023-11-08 DIAGNOSIS — R112 Nausea with vomiting, unspecified: Secondary | ICD-10-CM

## 2023-11-08 DIAGNOSIS — R1030 Lower abdominal pain, unspecified: Secondary | ICD-10-CM

## 2023-11-08 NOTE — Progress Notes (Signed)
  Because of the severity of your symptoms, I feel your condition warrants further evaluation and I recommend that you be seen in a face-to-face visit. Having UTI symptoms with nausea, vomiting, back and stomach pain, and fevers make this a more complicated UTI and you should be evaluated to rule out pyelonephritis, which is a more severe kidney infection that can be more difficult to treat.    NOTE: There will be NO CHARGE for this E-Visit   If you are having a true medical emergency, please call 911.     For an urgent face to face visit, [place] has multiple urgent care centers for your convenience.  Click the link below for the full list of locations and hours, walk-in wait times, appointment scheduling options and driving directions:  Urgent Care - [place], [place], [place], [place], [place], [place]  [place]     Your MyChart E-visit questionnaire answers were reviewed by a board certified advanced clinical practitioner to complete your personal care plan based on your specific symptoms.    Thank you for using e-Visits.     I have spent 5 minutes in review of e-visit questionnaire, review and updating patient chart, medical decision making and response to patient.   Delon CHRISTELLA Dickinson, PA-C

## 2023-11-11 ENCOUNTER — Telehealth: Admitting: Family Medicine

## 2023-11-11 DIAGNOSIS — R300 Dysuria: Secondary | ICD-10-CM

## 2023-11-11 NOTE — Progress Notes (Signed)
 Because of the severity of your symptoms- on E visit from 9/26 and another visit today- we are still recommending in person. We feel your condition warrants further evaluation and I recommend that you be seen in a face-to-face visit. Having UTI symptoms with nausea, vomiting, back and stomach pain, and fevers make this a more complicated UTI and you should be evaluated to rule out pyelonephritis, which is a more severe kidney infection that can be more difficult to treat.    NOTE: There will be NO CHARGE for this E-Visit   If you are having a true medical emergency, please call 911.

## 2023-11-12 ENCOUNTER — Ambulatory Visit

## 2023-11-14 ENCOUNTER — Ambulatory Visit

## 2023-11-19 ENCOUNTER — Ambulatory Visit: Admitting: Family Medicine

## 2023-11-19 NOTE — Progress Notes (Deleted)
    SUBJECTIVE:   CHIEF COMPLAINT / HPI:   ***  PERTINENT  PMH / PSH: ***  OBJECTIVE:   There were no vitals taken for this visit.  ***  ASSESSMENT/PLAN:   Assessment & Plan      Sarahann Cumins, DO [facility]

## 2023-11-21 ENCOUNTER — Ambulatory Visit

## 2024-03-01 NOTE — Progress Notes (Unsigned)
" ° °  SUBJECTIVE:   CHIEF COMPLAINT / HPI:   Discussed the use of AI scribe software for clinical note transcription with the patient, who gave verbal consent to proceed.  History of Present Illness Kelsey [name] is a 39 year old female who presents with worsening anxiety and depression after discontinuing Effexor .  Anxiety and depression - Worsening symptoms over the past three months after discontinuing Effexor  six months ago - Excessive worry, irritability, and avoidance behaviors, particularly involving her children - Significant disruption of sleep, often waking as early as 2 AM and unable to sit still or sleep well - Increased use of alcohol to cope with symptoms - Effexor , started in 2015, was the most effective medication compared to Paxil, Wellbutrin, and Cymbalta  - Discontinued Effexor  due to brain zaps with missed doses and perceived lack of efficacy  ADHD - Previously tried Adderall with good effect; Concerta  caused increased agitation  Chest pain and palpitations - Exertional chest pain during activities such as cleaning, radiating to shoulder and back - Associated palpitations and elevated resting heart rate (111 to 129 bpm) - History of ventricular septal defect (VSD) and concern about cardiac health - Albuterol  used as needed, but did not relieve recent chest pain - no current pain  Tobacco use - Smokes one pack per day   OBJECTIVE:   BP 123/65   Pulse 82   Wt 162 lb 9.6 oz (73.8 kg)   SpO2 100%   BMI 27.91 kg/m   Gen: well appearing, in NAD Card: RRR Lungs: CTAB Ext: WWP  ASSESSMENT/PLAN:   Anxiety and depression Recurrent moderate major depressive disorder with comorbid anxiety. Effexor  was effective at 75 mg but caused brain zaps at 150 mg. Discussed options, elected for Effexor  at 75 mg. Will offer Buspirone  prn.  - Prescribed Effexor  75 mg daily. - Prescribed buspirone  7.5 mg as needed for anxiety. - Referred to therapy for psychological  support. - f/u 2 weeks.  Ventricular septal defect Known VSD. Not currently followed by Cardiology. ECHO ordered today.   Palpitations With chest pain. Has known VSD. EKG and exam wnl today. Possibly increased after stopping effexor , question anxiety contributing. Obtaining labs to assess for other contributors. ECHO given known VSD. Low threshold for Cardiology referral at follow up if workup unremarkable.   TOBACCO ABUSE Increased with worsened anxiety. Now at 1ppd. May also contribute to respiratory symptoms and chest tightness. Discussed addition of wellbutrin, will defer for now given anxiety uncontrolled.  Alcohol use disorder, severe, dependence (HCC) Worsened since anxiety, depression worsened. Therapy resources offered. Consider VBCI referral at follow up.      Donald HERO Lai, DO "

## 2024-03-02 ENCOUNTER — Ambulatory Visit: Admitting: Family Medicine

## 2024-03-02 ENCOUNTER — Encounter: Payer: Self-pay | Admitting: Family Medicine

## 2024-03-02 VITALS — BP 123/65 | HR 82 | Wt 162.6 lb

## 2024-03-02 DIAGNOSIS — Q210 Ventricular septal defect: Secondary | ICD-10-CM | POA: Diagnosis not present

## 2024-03-02 DIAGNOSIS — R002 Palpitations: Secondary | ICD-10-CM | POA: Diagnosis present

## 2024-03-02 DIAGNOSIS — F908 Attention-deficit hyperactivity disorder, other type: Secondary | ICD-10-CM

## 2024-03-02 DIAGNOSIS — F419 Anxiety disorder, unspecified: Secondary | ICD-10-CM

## 2024-03-02 DIAGNOSIS — F331 Major depressive disorder, recurrent, moderate: Secondary | ICD-10-CM | POA: Diagnosis not present

## 2024-03-02 DIAGNOSIS — Z1159 Encounter for screening for other viral diseases: Secondary | ICD-10-CM

## 2024-03-02 DIAGNOSIS — R079 Chest pain, unspecified: Secondary | ICD-10-CM

## 2024-03-02 DIAGNOSIS — F1020 Alcohol dependence, uncomplicated: Secondary | ICD-10-CM | POA: Diagnosis not present

## 2024-03-02 DIAGNOSIS — F17200 Nicotine dependence, unspecified, uncomplicated: Secondary | ICD-10-CM | POA: Diagnosis not present

## 2024-03-02 MED ORDER — BUSPIRONE HCL 7.5 MG PO TABS: 7.5000 mg | ORAL_TABLET | Freq: Three times a day (TID) | ORAL | 0 refills | Status: AC | PRN

## 2024-03-02 MED ORDER — VENLAFAXINE HCL ER 75 MG PO CP24: 75.0000 mg | ORAL_CAPSULE | Freq: Every day | ORAL | 0 refills | Status: AC

## 2024-03-02 NOTE — Assessment & Plan Note (Signed)
 With chest pain. Has known VSD. EKG and exam wnl today. Possibly increased after stopping effexor , question anxiety contributing. Obtaining labs to assess for other contributors. ECHO given known VSD. Low threshold for Cardiology referral at follow up if workup unremarkable.

## 2024-03-02 NOTE — Assessment & Plan Note (Signed)
 Worsened since anxiety, depression worsened. Therapy resources offered. Consider VBCI referral at follow up.

## 2024-03-02 NOTE — Patient Instructions (Addendum)
 It was great to see you!  Our plans for today:  - We are restarting the effexor . Take buspirone  as needed for anxiety.  - We are ordering an echocardiogram. Let us  know if you don't hear about an appointment in the next few weeks.  - See below for therapy recommendations. - Come back in 2-4 weeks for follow up.   We are checking some labs today, we will release these results to your MyChart.  Take care and seek immediate care sooner if you develop any concerns.   Dr. Roshanda Balazs    Therapy and Counseling Resources Most providers on this list will take Medicaid. Patients with commercial insurance or Medicare should contact their insurance company to get a list of in network providers.  [facility] (takes children) Location 1: [address] [place], [place] [address] Location 2: [address] [place], [place] [address] [phone]   [facility] (spanish speaking therapist available)(habla espanol)(take medicare and medicaid)  [address], [place], [place] [address], [place]  [email or web address] [phone]  [facility] [address] [place], [place] [address] [phone]  [facility]   [address], [address] [place], [place] [address]      [phone]  [facility] (spanish available) [address]  [place], [place] [address] [phone]  [facility] (take [facility] and medicare) [address], [address]  [place], [place] [address]       [phone]     [facility] (virtual only) [phone]  [facility] [address], [place], [place] [phone]  [facility]:  [address] [place] [phone]  [facility]:  [address] [place] [phone]  [facility] (under & uninsured) [address]   [place] [phone]    [email or web address]     [facility] [address]  [place]    [phone]  [facility] -[address]     Phone:  [phone]     [facility]-  [address]  [phone]   [facility] [address]      [place], [place] [phone]  [facility]: [address] [place], [place], [place]  [phone]   [facility] (Spanish therapist) [email or web address]  [address] [place]  [address]   [place] [address]    [phone]    [facility]   [address],  [place], [place]  [phone]   [facility]  [address] [place]  [phone]  [facility]  [address] [place], [place]        [phone]  Open Access/Walk In Clinic under & uninsured  [facility]  [address] [place], [place] [phone] Crisis [phone]  [facility] [address],  (Spanish)   [address] , [place]: [phone]) 8:30 - 12; 1 - 2:30  [facility] [employer],  [address] [place], [place]    [phone]):8:30 - 12; 2 - 3PM  [facility] [employer],  [address]  [place]; [phone]):   Mon - Fri 8 AM - 5 PM  [facility] [address] [place]  MWF 12:30 to 3:00 or call to schedule an appointment  [phone]  Specific Provider options Psychology Today  [email or web address].[email or web address]/us  click on find a therapist  enter your zip code left side and select or tailor a therapist for your specific need.   [facility] Provider Directory [email or web address]  (Medicaid)   Follow all drop down to find a provider  Social Support program [facility] [place] [phone] or photosolver.pl [address], [place], [place] Recovery support and educational   24- Hour Availability:   [facility]  [address] [place], [place] [phone] Crisis [phone]  [facility] [phone]  [place]  [phone]   [facility]  [phone] (after hours)  Therapeutic Alternative/Mobile Crisis   [phone]  [place]  National Suicide Hotline  253-348-7799 MERRILY[place])  Call 911 or go to emergency room  [facility]  [phone]);  [place] and [place]  [phone]); [place], [place], [place], [place], [place], [place], [place]

## 2024-03-02 NOTE — Assessment & Plan Note (Addendum)
 Recurrent moderate major depressive disorder with comorbid anxiety. Effexor  was effective at 75 mg but caused brain zaps at 150 mg. Discussed options, elected for Effexor  at 75 mg. Will offer Buspirone  prn.  - Prescribed Effexor  75 mg daily. - Prescribed buspirone  7.5 mg as needed for anxiety. - Referred to therapy for psychological support. - f/u 2 weeks.

## 2024-03-02 NOTE — Assessment & Plan Note (Signed)
 Increased with worsened anxiety. Now at 1ppd. May also contribute to respiratory symptoms and chest tightness. Discussed addition of wellbutrin, will defer for now given anxiety uncontrolled.

## 2024-03-02 NOTE — Assessment & Plan Note (Signed)
 Known VSD. Not currently followed by Cardiology. ECHO ordered today.

## 2024-03-03 ENCOUNTER — Ambulatory Visit: Payer: Self-pay | Admitting: Family Medicine

## 2024-03-03 LAB — CBC
Hematocrit: 40.7 % (ref 34.0–46.6)
Hemoglobin: 13.7 g/dL (ref 11.1–15.9)
MCH: 34.9 pg — ABNORMAL HIGH (ref 26.6–33.0)
MCHC: 33.7 g/dL (ref 31.5–35.7)
MCV: 104 fL — ABNORMAL HIGH (ref 79–97)
Platelets: 318 x10E3/uL (ref 150–450)
RBC: 3.92 x10E6/uL (ref 3.77–5.28)
RDW: 12.9 % (ref 11.7–15.4)
WBC: 6.9 x10E3/uL (ref 3.4–10.8)

## 2024-03-03 LAB — HEPATITIS B SURFACE ANTIBODY,QUALITATIVE

## 2024-03-03 LAB — TSH: TSH: 1.05 u[IU]/mL (ref 0.450–4.500)
